# Patient Record
Sex: Female | Born: 1980 | Race: White | Hispanic: No | Marital: Married | Smoking: Never smoker
Health system: Southern US, Community
[De-identification: ages and names within clinical notes are randomized; demographics above are authoritative.]

## PROBLEM LIST (undated history)

## (undated) DIAGNOSIS — H9193 Unspecified hearing loss, bilateral: Secondary | ICD-10-CM

---

## 1999-10-08 ENCOUNTER — Other Ambulatory Visit: Admission: RE | Admit: 1999-10-08 | Discharge: 1999-10-08 | Payer: Self-pay | Admitting: *Deleted

## 2000-10-25 ENCOUNTER — Other Ambulatory Visit: Admission: RE | Admit: 2000-10-25 | Discharge: 2000-10-25 | Payer: Self-pay | Admitting: Obstetrics and Gynecology

## 2001-08-08 ENCOUNTER — Encounter: Payer: Self-pay | Admitting: Internal Medicine

## 2001-08-08 ENCOUNTER — Ambulatory Visit (HOSPITAL_COMMUNITY): Admission: RE | Admit: 2001-08-08 | Discharge: 2001-08-08 | Payer: Self-pay | Admitting: Internal Medicine

## 2001-10-26 ENCOUNTER — Other Ambulatory Visit: Admission: RE | Admit: 2001-10-26 | Discharge: 2001-10-26 | Payer: Self-pay | Admitting: Obstetrics and Gynecology

## 2002-11-07 ENCOUNTER — Other Ambulatory Visit: Admission: RE | Admit: 2002-11-07 | Discharge: 2002-11-07 | Payer: Self-pay | Admitting: Obstetrics and Gynecology

## 2008-10-21 ENCOUNTER — Inpatient Hospital Stay (HOSPITAL_COMMUNITY): Admission: AD | Admit: 2008-10-21 | Discharge: 2008-10-24 | Payer: Self-pay | Admitting: Obstetrics & Gynecology

## 2010-04-09 LAB — HEPATIC FUNCTION PANEL
ALT: 25 U/L (ref 0–35)
AST: 54 U/L — ABNORMAL HIGH (ref 0–37)
Albumin: 2.2 g/dL — ABNORMAL LOW (ref 3.5–5.2)
Alkaline Phosphatase: 156 U/L — ABNORMAL HIGH (ref 39–117)
Bilirubin, Direct: 0.2 mg/dL (ref 0.0–0.3)
Indirect Bilirubin: 0.2 mg/dL — ABNORMAL LOW (ref 0.3–0.9)
Total Bilirubin: 0.4 mg/dL (ref 0.3–1.2)
Total Protein: 5.3 g/dL — ABNORMAL LOW (ref 6.0–8.3)

## 2010-04-09 LAB — CBC
HCT: 32 % — ABNORMAL LOW (ref 36.0–46.0)
HCT: 39.2 % (ref 36.0–46.0)
Hemoglobin: 10.6 g/dL — ABNORMAL LOW (ref 12.0–15.0)
Hemoglobin: 13 g/dL (ref 12.0–15.0)
MCHC: 33.1 g/dL (ref 30.0–36.0)
MCHC: 33.3 g/dL (ref 30.0–36.0)
MCV: 85.2 fL (ref 78.0–100.0)
MCV: 86.2 fL (ref 78.0–100.0)
Platelets: 148 10*3/uL — ABNORMAL LOW (ref 150–400)
Platelets: 173 10*3/uL (ref 150–400)
RBC: 3.71 MIL/uL — ABNORMAL LOW (ref 3.87–5.11)
RBC: 4.59 MIL/uL (ref 3.87–5.11)
RDW: 15.5 % (ref 11.5–15.5)
RDW: 16.1 % — ABNORMAL HIGH (ref 11.5–15.5)
WBC: 17.9 10*3/uL — ABNORMAL HIGH (ref 4.0–10.5)
WBC: 9.5 10*3/uL (ref 4.0–10.5)

## 2010-04-09 LAB — RPR: RPR Ser Ql: NONREACTIVE

## 2010-04-09 LAB — CCBB MATERNAL DONOR DRAW

## 2012-01-14 ENCOUNTER — Other Ambulatory Visit (HOSPITAL_COMMUNITY): Payer: Self-pay | Admitting: Physician Assistant

## 2012-01-14 DIAGNOSIS — R109 Unspecified abdominal pain: Secondary | ICD-10-CM

## 2012-01-14 DIAGNOSIS — K591 Functional diarrhea: Secondary | ICD-10-CM

## 2012-01-17 ENCOUNTER — Ambulatory Visit (HOSPITAL_COMMUNITY)
Admission: RE | Admit: 2012-01-17 | Discharge: 2012-01-17 | Disposition: A | Discharge status: Home / Self Care | Payer: BC Managed Care – PPO | Source: Ambulatory Visit | Attending: Physician Assistant | Admitting: Physician Assistant

## 2012-01-17 DIAGNOSIS — K591 Functional diarrhea: Secondary | ICD-10-CM

## 2012-01-17 DIAGNOSIS — R109 Unspecified abdominal pain: Secondary | ICD-10-CM

## 2012-01-17 DIAGNOSIS — R1310 Dysphagia, unspecified: Secondary | ICD-10-CM | POA: Insufficient documentation

## 2012-01-26 ENCOUNTER — Encounter (INDEPENDENT_AMBULATORY_CARE_PROVIDER_SITE_OTHER): Payer: Self-pay | Admitting: *Deleted

## 2012-01-31 ENCOUNTER — Encounter (INDEPENDENT_AMBULATORY_CARE_PROVIDER_SITE_OTHER): Payer: Self-pay | Admitting: Internal Medicine

## 2012-01-31 ENCOUNTER — Ambulatory Visit (INDEPENDENT_AMBULATORY_CARE_PROVIDER_SITE_OTHER): Payer: BC Managed Care – PPO | Admitting: Internal Medicine

## 2012-01-31 VITALS — BP 90/52 | HR 76 | Temp 98.4°F | Ht 64.0 in | Wt 134.9 lb

## 2012-01-31 DIAGNOSIS — R197 Diarrhea, unspecified: Secondary | ICD-10-CM | POA: Insufficient documentation

## 2012-01-31 DIAGNOSIS — K589 Irritable bowel syndrome without diarrhea: Secondary | ICD-10-CM

## 2012-01-31 MED ORDER — HYOSCYAMINE SULFATE 0.125 MG SL SUBL: 0.1250 mg | SUBLINGUAL_TABLET | SUBLINGUAL | Status: DC | PRN

## 2012-01-31 NOTE — Progress Notes (Addendum)
Subjective:     Patient ID: Marilyn Bryant, female   DOB: 11-25-1980, 32 y.o.   MRN: [id number]  HPI Referred to our office for diarrhea. The first of the year, everytime she ate she would have diarrhea. She would have lower abdominal pain and then she would go to the bathroom. Her symptoms last about 7 days. No fever associated with her symptoms. She tells me she has a hx of as soon as she eats, she will have diarrhea.  She tells me she is 80% better.  She did have diarrhea Saturday. Her stools are formed now. Actually she is more on the constipated side.  Appetite is good. No weight loss.  No epigastric pain. She tells me if feels like gas.  She tried eat wheat and it did upset her stomach, however this weekend she ate white bread and it did not bother her. Stool studies were negative. Swallow test was normal.   No dysphagia. 01/14/2012 NA 137, K 3.9, Chloride 101, ALP 50, AST 17, ALT 14, Calcium 9.6, 01/14/2012 H and H 14.3 and 40.8, Platelet ct 294 Celiac Panel, IgA negative, gliadin Peptide AB igG 30.2 high. (positive)  (usually less than 20), H and H 14.3 and 40.8, MCV 87.7, sedrate 4, TSH 1.563 Stool lactoferrin negative, c diff negative. Stool culture negative for salmonella, shigelia, campylobacter, Yersinia, or E. Coli H. Pylori negative 01/14/2012 Esophagram: normal   Patient has an IUD  Review of Systems see hpi Current Outpatient Prescriptions  Medication Sig Dispense Refill  . ibuprofen (ADVIL,MOTRIN) 100 MG chewable tablet Chew 100 mg by mouth every 8 (eight) hours as needed.       History reviewed. No pertinent past medical history. Past Surgical History  Procedure Date  . Cesarean section     2010   Allergies not on file       Objective:   Physical Exam  Filed Vitals:   01/31/12 1517  BP: 90/52  Pulse: 76  Temp: 98.4 F (36.9 C)  Height: 5\' 4"  (1.626 m)  Weight: 134 lb 14.4 oz (61.19 kg)   Alert and oriented. Skin warm and dry. Oral mucosa is moist.   .  Sclera anicteric, conjunctivae is pink. Thyroid not enlarged. No cervical lymphadenopathy. Lungs clear. Heart regular rate and rhythm.  Abdomen is soft. Bowel sounds are positive. No hepatomegaly. No abdominal masses felt. No tenderness.  No edema to lower extremities.       Assessment:    IBS. Symptoms for the most part have resolved now. Stools studies negative. I discussed this case with Dr. Karilyn Cota. Celiac panel is negative.    Plan:    Levsin 0.125mg  sl. OV prn. If any problems, please call our office.

## 2012-01-31 NOTE — Patient Instructions (Addendum)
Start Levsin q 4 hrs as need. OV prn,.

## 2012-02-09 ENCOUNTER — Encounter (INDEPENDENT_AMBULATORY_CARE_PROVIDER_SITE_OTHER): Payer: Self-pay

## 2013-03-16 ENCOUNTER — Emergency Department (HOSPITAL_COMMUNITY)
Admission: EM | Admit: 2013-03-16 | Discharge: 2013-03-16 | Disposition: A | Discharge status: Home / Self Care | Payer: BC Managed Care – PPO | Attending: Emergency Medicine | Admitting: Emergency Medicine

## 2013-03-16 ENCOUNTER — Encounter (HOSPITAL_COMMUNITY): Payer: Self-pay | Admitting: Emergency Medicine

## 2013-03-16 DIAGNOSIS — M549 Dorsalgia, unspecified: Secondary | ICD-10-CM | POA: Insufficient documentation

## 2013-03-16 DIAGNOSIS — Z3202 Encounter for pregnancy test, result negative: Secondary | ICD-10-CM | POA: Insufficient documentation

## 2013-03-16 DIAGNOSIS — R55 Syncope and collapse: Secondary | ICD-10-CM

## 2013-03-16 LAB — BASIC METABOLIC PANEL
BUN: 14 mg/dL (ref 6–23)
CO2: 27 mEq/L (ref 19–32)
Calcium: 9.3 mg/dL (ref 8.4–10.5)
Chloride: 99 mEq/L (ref 96–112)
Creatinine, Ser: 0.66 mg/dL (ref 0.50–1.10)
GFR calc Af Amer: >90 mL/min (ref >90)
Glucose, Bld: 143 mg/dL — ABNORMAL HIGH (ref 70–99)
Potassium: 4.1 mEq/L (ref 3.7–5.3)
SODIUM: 139 meq/L (ref 137–147)

## 2013-03-16 LAB — I-STAT TROPONIN, ED: Troponin i, poc: 0 ng/mL (ref 0.00–0.08)

## 2013-03-16 LAB — CBC
HCT: 38.7 % (ref 36.0–46.0)
Hemoglobin: 13.3 g/dL (ref 12.0–15.0)
MCH: 30.8 pg (ref 26.0–34.0)
MCHC: 34.4 g/dL (ref 30.0–36.0)
MCV: 89.6 fL (ref 78.0–100.0)
PLATELETS: 255 10*3/uL (ref 150–400)
RBC: 4.32 MIL/uL (ref 3.87–5.11)
RDW: 12.8 % (ref 11.5–15.5)
WBC: 11 10*3/uL — ABNORMAL HIGH (ref 4.0–10.5)

## 2013-03-16 LAB — URINALYSIS, ROUTINE W REFLEX MICROSCOPIC
BILIRUBIN URINE: NEGATIVE
Glucose, UA: NEGATIVE mg/dL
HGB URINE DIPSTICK: NEGATIVE
Ketones, ur: 15 mg/dL — AB
Nitrite: NEGATIVE
PROTEIN: NEGATIVE mg/dL
Specific Gravity, Urine: 1.016 (ref 1.005–1.030)
UROBILINOGEN UA: 0.2 mg/dL (ref 0.0–1.0)
pH: 7 (ref 5.0–8.0)

## 2013-03-16 LAB — CBG MONITORING, ED: Glucose-Capillary: 89 mg/dL (ref 70–99)

## 2013-03-16 LAB — URINE MICROSCOPIC-ADD ON

## 2013-03-16 LAB — POC URINE PREG, ED: Preg Test, Ur: NEGATIVE

## 2013-03-16 NOTE — ED Provider Notes (Signed)
I saw and evaluated the patient, reviewed the resident's note and I agree with the findings and plan.   Date: 03/16/2013  Rate: 96  Rhythm: normal sinus rhythm  QRS Axis: normal  Intervals: normal  ST/T Wave abnormalities: nonspecific T waves  Conduction Disutrbances:none  Narrative Interpretation:   Old EKG Reviewed: none available   With recurrent right lower back pain. She states her pain radiates down to her knee. She feels like she passed out because the pain got so severe when she tried to stand up and twisted. She states she felt nauseous, lightheaded and gradually passed out. She denies chest pain or shortness of breath. Her EKG is benign for a cause of syncope. Her pain is controlled at this time. There is no midline back tenderness and I do not feel she needs an x-ray. Her pain is not near her CVA she has not had any dysuria or hematuria to suggest kidney stone. This time we'll treat his sciatica and her followup with her PCP. I feel that her syncope is related to her pain/vagal response.  Ephraim Hamburger, MD 03/16/13 [phone]

## 2013-03-16 NOTE — ED Notes (Addendum)
Pt was at work and had a sudden R flank pain and stood up to tell co-workers and they said she "turned white and passed out." They assisted her to floor and "then i woke back up after about 30 seconds." now she continues to have R flank pain. She is A&Ox4, ambulatory. She c/o feeling lightheaded now. She didn't eat breakfast this morning.

## 2013-03-16 NOTE — ED Provider Notes (Signed)
CSN: [id number]     Arrival date & time 03/16/13  1338 History   First MD Initiated Contact with Patient 03/16/13 1502     Chief Complaint  Patient presents with  . Loss of Consciousness     (Consider location/radiation/quality/duration/timing/severity/associated sxs/prior Treatment) Patient is a 33 y.o. female presenting with syncope. The history is provided by the patient.  Loss of Consciousness Episode history:  Single Most recent episode:  Today Duration:  30 seconds Timing:  Constant Progression:  Resolved Chronicity:  New Context: standing up   Context: not blood draw and not exertion   Context comment:  Pt has hx of subacute R LBP and states that she felt awas hurting, twisted the wrong way and had severe pain, stood up and than passed out for about 30 seconds, no jerking, came back to normal baseline when woke up, no b/b incontinence, no tongue bitign, Witnessed: yes   Relieved by:  Nothing Worsened by:  Nothing tried Ineffective treatments:  None tried Associated symptoms: no chest pain, no diaphoresis, no difficulty breathing, no fever, no focal weakness, no headaches, no recent fall, no shortness of breath, no vomiting and no weakness   Risk factors: no coronary artery disease     History reviewed. No pertinent past medical history. Past Surgical History  Procedure Laterality Date  . Cesarean section      2010   History reviewed. No pertinent family history. History  Substance Use Topics  . Smoking status: Never Smoker   . Smokeless tobacco: Not on file  . Alcohol Use: No   OB History   Grav Para Term Preterm Abortions TAB SAB Ect Mult Living                 Review of Systems  Constitutional: Negative for fever, diaphoresis, activity change and appetite change.  HENT: Negative for congestion and sore throat.   Eyes: Negative for discharge and itching.  Respiratory: Negative for shortness of breath and wheezing.   Cardiovascular: Positive for syncope.  Negative for chest pain.  Gastrointestinal: Negative for vomiting and abdominal pain.  Genitourinary: Negative for decreased urine volume and difficulty urinating.  Musculoskeletal: Positive for back pain.  Skin: Negative for rash.  Neurological: Positive for syncope. Negative for focal weakness, weakness and headaches.      Allergies  Review of patient's allergies indicates no active allergies.  Home Medications   Current Outpatient Rx  Name  Route  Sig  Dispense  Refill  . ibuprofen (ADVIL,MOTRIN) 100 MG chewable tablet   Oral   Chew 100 mg by mouth every 8 (eight) hours as needed.          BP 102/56  Pulse 89  Temp(Src) 98 F (36.7 C) (Oral)  Resp 14  Ht 5\' 4"  (1.626 m)  Wt 131 lb (59.421 kg)  BMI 22.47 kg/m2  SpO2 100% Physical Exam  Constitutional: She is oriented to person, place, and time. She appears well-developed and well-nourished. No distress.  HENT:  Head: Normocephalic and atraumatic.  Mouth/Throat: Oropharynx is clear and moist.  Eyes: Conjunctivae and EOM are normal. Pupils are equal, round, and reactive to light. Right eye exhibits no discharge. Left eye exhibits no discharge. No scleral icterus.  Neck: Normal range of motion. Neck supple.  Cardiovascular: Normal rate, regular rhythm and intact distal pulses.  Exam reveals no gallop and no friction rub.   No murmur heard. Pulmonary/Chest: Effort normal and breath sounds normal. No respiratory distress. She has no wheezes. She  has no rales.  Abdominal: Soft. She exhibits no distension and no mass. There is no tenderness.  Musculoskeletal: Normal range of motion.  Neurological: She is alert and oriented to person, place, and time. No cranial nerve deficit. She exhibits normal muscle tone. Coordination normal.  5/5 strength in all exts, normal sensation in all exts, 2+ DTRs in patella and brachioradilias b/l, F2N and H2S neg, no ataxia with ambulation  Skin: She is not diaphoretic.    ED Course   Procedures (including critical care time) Labs Review Labs Reviewed  CBC - Abnormal; Notable for the following:    WBC 11.0 (*)    All other components within normal limits  BASIC METABOLIC PANEL - Abnormal; Notable for the following:    Glucose, Bld 143 (*)    All other components within normal limits  URINALYSIS, ROUTINE W REFLEX MICROSCOPIC - Abnormal; Notable for the following:    Ketones, ur 15 (*)    Leukocytes, UA TRACE (*)    All other components within normal limits  URINE MICROSCOPIC-ADD ON - Abnormal; Notable for the following:    Squamous Epithelial / LPF FEW (*)    Bacteria, UA FEW (*)    All other components within normal limits  I-STAT TROPOININ, ED  POC URINE PREG, ED  CBG MONITORING, ED  CBG MONITORING, ED   Imaging Review No results found.   EKG Interpretation None      MDM   33 y.o. WF w/ cc: of syncope after R flank pain. Pt with hx of R flank pain for past "4 years" states it has bothered her since she had her child 4 years ago and states that she aggravated it, turned the wrong way and had immense pain, and then stood up and passed out. No signs sxs c/w seizure. No hx of prior. Well appearing, no palpitations, unlikely arrythmia. EKG with no ischemic change or signs of arrythmia. Not c/w w/ pyelo or kidney stone, urine negative. Likely had vagal event from sudden surprising pain. Offered analgesia, pt politely declined. Will discharge. Follow up PCP as needed. Care of case d/w my attending.  Final diagnoses:  Syncope    Discharged    Sol Passer, MD 03/16/13 [phone]

## 2013-03-16 NOTE — Discharge Instructions (Signed)
Sciatica °Sciatica is pain, weakness, numbness, or tingling along the path of the sciatic nerve. The nerve starts in the lower back and runs down the back of each leg. The nerve controls the muscles in the lower leg and in the back of the knee, while also providing sensation to the back of the thigh, lower leg, and the sole of your foot. Sciatica is a symptom of another medical condition. For instance, nerve damage or certain conditions, such as a herniated disk or bone spur on the spine, pinch or put pressure on the sciatic nerve. This causes the pain, weakness, or other sensations normally associated with sciatica. Generally, sciatica only affects one side of the body. °CAUSES  °· Herniated or slipped disc. °· Degenerative disk disease. °· A pain disorder involving the narrow muscle in the buttocks (piriformis syndrome). °· Pelvic injury or fracture. °· Pregnancy. °· Tumor (rare). °SYMPTOMS  °Symptoms can vary from mild to very severe. The symptoms usually travel from the low back to the buttocks and down the back of the leg. Symptoms can include: °· Mild tingling or dull aches in the lower back, leg, or hip. °· Numbness in the back of the calf or sole of the foot. °· Burning sensations in the lower back, leg, or hip. °· Sharp pains in the lower back, leg, or hip. °· Leg weakness. °· Severe back pain inhibiting movement. °These symptoms may get worse with coughing, sneezing, laughing, or prolonged sitting or standing. Also, being overweight may worsen symptoms. °DIAGNOSIS  °Your caregiver will perform a physical exam to look for common symptoms of sciatica. He or she may ask you to do certain movements or activities that would trigger sciatic nerve pain. Other tests may be performed to find the cause of the sciatica. These may include: °· Blood tests. °· X-rays. °· Imaging tests, such as an MRI or CT scan. °TREATMENT  °Treatment is directed at the cause of the sciatic pain. Sometimes, treatment is not necessary  and the pain and discomfort goes away on its own. If treatment is needed, your caregiver may suggest: °· Over-the-counter medicines to relieve pain. °· Prescription medicines, such as anti-inflammatory medicine, muscle relaxants, or narcotics. °· Applying heat or ice to the painful area. °· Steroid injections to lessen pain, irritation, and inflammation around the nerve. °· Reducing activity during periods of pain. °· Exercising and stretching to strengthen your abdomen and improve flexibility of your spine. Your caregiver may suggest losing weight if the extra weight makes the back pain worse. °· Physical therapy. °· Surgery to eliminate what is pressing or pinching the nerve, such as a bone spur or part of a herniated disk. °HOME CARE INSTRUCTIONS  °· Only take over-the-counter or prescription medicines for pain or discomfort as directed by your caregiver. °· Apply ice to the affected area for 20 minutes, 3 4 times a day for the first 48 72 hours. Then try heat in the same way. °· Exercise, stretch, or perform your usual activities if these do not aggravate your pain. °· Attend physical therapy sessions as directed by your caregiver. °· Keep all follow-up appointments as directed by your caregiver. °· Do not wear high heels or shoes that do not provide proper support. °· Check your mattress to see if it is too soft. A firm mattress may lessen your pain and discomfort. °SEEK IMMEDIATE MEDICAL CARE IF:  °· You lose control of your bowel or bladder (incontinence). °· You have increasing weakness in the lower back,   pelvis, buttocks, or legs. °· You have redness or swelling of your back. °· You have a burning sensation when you urinate. °· You have pain that gets worse when you lie down or awakens you at night. °· Your pain is worse than you have experienced in the past. °· Your pain is lasting longer than 4 weeks. °· You are suddenly losing weight without reason. °MAKE SURE YOU: °· Understand these  instructions. °· Will watch your condition. °· Will get help right away if you are not doing well or get worse. °Document Released: 12/15/2000 Document Revised: 06/22/2011 Document Reviewed: 05/02/2011 °[employer]® Patient Information ©2014 [employer]. ° °

## 2013-03-21 ENCOUNTER — Ambulatory Visit (HOSPITAL_COMMUNITY)
Admission: RE | Admit: 2013-03-21 | Discharge: 2013-03-21 | Disposition: A | Discharge status: Home / Self Care | Payer: BC Managed Care – PPO | Source: Ambulatory Visit | Attending: Family Medicine | Admitting: Family Medicine

## 2013-03-21 ENCOUNTER — Other Ambulatory Visit (HOSPITAL_COMMUNITY): Payer: Self-pay | Admitting: Family Medicine

## 2013-03-21 DIAGNOSIS — M545 Low back pain, unspecified: Secondary | ICD-10-CM

## 2013-03-21 DIAGNOSIS — Z975 Presence of (intrauterine) contraceptive device: Secondary | ICD-10-CM | POA: Insufficient documentation

## 2013-03-21 DIAGNOSIS — M4120 Other idiopathic scoliosis, site unspecified: Secondary | ICD-10-CM | POA: Insufficient documentation

## 2013-03-22 ENCOUNTER — Other Ambulatory Visit (HOSPITAL_COMMUNITY): Payer: Self-pay | Admitting: Family Medicine

## 2013-03-22 DIAGNOSIS — M545 Low back pain, unspecified: Secondary | ICD-10-CM

## 2013-03-23 ENCOUNTER — Encounter (HOSPITAL_COMMUNITY): Payer: Self-pay

## 2013-03-23 ENCOUNTER — Other Ambulatory Visit (HOSPITAL_COMMUNITY): Payer: Self-pay | Admitting: Family Medicine

## 2013-03-23 ENCOUNTER — Ambulatory Visit (HOSPITAL_COMMUNITY)
Admission: RE | Admit: 2013-03-23 | Discharge: 2013-03-23 | Disposition: A | Discharge status: Home / Self Care | Payer: BC Managed Care – PPO | Source: Ambulatory Visit | Attending: Family Medicine | Admitting: Family Medicine

## 2013-03-23 DIAGNOSIS — M5137 Other intervertebral disc degeneration, lumbosacral region: Secondary | ICD-10-CM | POA: Insufficient documentation

## 2013-03-23 DIAGNOSIS — R229 Localized swelling, mass and lump, unspecified: Secondary | ICD-10-CM | POA: Insufficient documentation

## 2013-03-23 DIAGNOSIS — M545 Low back pain, unspecified: Secondary | ICD-10-CM

## 2013-03-23 DIAGNOSIS — M51379 Other intervertebral disc degeneration, lumbosacral region without mention of lumbar back pain or lower extremity pain: Secondary | ICD-10-CM | POA: Insufficient documentation

## 2013-03-23 DIAGNOSIS — L723 Sebaceous cyst: Secondary | ICD-10-CM | POA: Insufficient documentation

## 2013-05-29 ENCOUNTER — Other Ambulatory Visit: Payer: Self-pay | Admitting: Obstetrics & Gynecology

## 2013-06-01 ENCOUNTER — Encounter (HOSPITAL_COMMUNITY): Payer: Self-pay | Admitting: *Deleted

## 2013-06-01 ENCOUNTER — Encounter (HOSPITAL_COMMUNITY): Payer: Self-pay | Admitting: Pharmacist

## 2013-06-11 ENCOUNTER — Encounter (HOSPITAL_COMMUNITY)
Admission: RE | Disposition: A | Discharge status: Home / Self Care | Payer: Self-pay | Source: Ambulatory Visit | Attending: Obstetrics & Gynecology

## 2013-06-11 ENCOUNTER — Ambulatory Visit (HOSPITAL_COMMUNITY)
Admission: RE | Admit: 2013-06-11 | Discharge: 2013-06-11 | Disposition: A | Discharge status: Home / Self Care | Payer: BC Managed Care – PPO | Source: Ambulatory Visit | Attending: Obstetrics & Gynecology | Admitting: Obstetrics & Gynecology

## 2013-06-11 ENCOUNTER — Encounter (HOSPITAL_COMMUNITY): Payer: Self-pay | Admitting: *Deleted

## 2013-06-11 ENCOUNTER — Ambulatory Visit (HOSPITAL_COMMUNITY): Payer: BC Managed Care – PPO | Admitting: Anesthesiology

## 2013-06-11 ENCOUNTER — Encounter (HOSPITAL_COMMUNITY): Payer: BC Managed Care – PPO | Admitting: Anesthesiology

## 2013-06-11 DIAGNOSIS — Q505 Embryonic cyst of broad ligament: Secondary | ICD-10-CM

## 2013-06-11 DIAGNOSIS — N949 Unspecified condition associated with female genital organs and menstrual cycle: Secondary | ICD-10-CM | POA: Insufficient documentation

## 2013-06-11 DIAGNOSIS — N83209 Unspecified ovarian cyst, unspecified side: Secondary | ICD-10-CM | POA: Insufficient documentation

## 2013-06-11 DIAGNOSIS — Q504 Embryonic cyst of fallopian tube: Secondary | ICD-10-CM | POA: Insufficient documentation

## 2013-06-11 HISTORY — DX: Unspecified hearing loss, bilateral: H91.93

## 2013-06-11 HISTORY — PX: ROBOTIC ASSISTED LAPAROSCOPIC OVARIAN CYSTECTOMY: SHX6081

## 2013-06-11 LAB — CBC
HCT: 41.7 % (ref 36.0–46.0)
Hemoglobin: 14.2 g/dL (ref 12.0–15.0)
MCH: 30.8 pg (ref 26.0–34.0)
MCHC: 34.1 g/dL (ref 30.0–36.0)
MCV: 90.5 fL (ref 78.0–100.0)
Platelets: 254 10*3/uL (ref 150–400)
RBC: 4.61 MIL/uL (ref 3.87–5.11)
RDW: 12.7 % (ref 11.5–15.5)
WBC: 6 10*3/uL (ref 4.0–10.5)

## 2013-06-11 LAB — TYPE AND SCREEN
ABO/RH(D): O POS
Antibody Screen: NEGATIVE

## 2013-06-11 LAB — PREGNANCY, URINE: PREG TEST UR: NEGATIVE

## 2013-06-11 LAB — ABO/RH: ABO/RH(D): O POS

## 2013-06-11 SURGERY — ROBOTIC ASSISTED LAPAROSCOPIC OVARIAN CYSTECTOMY
Anesthesia: General | Site: Abdomen | Laterality: Right

## 2013-06-11 MED ORDER — ROCURONIUM BROMIDE 100 MG/10ML IV SOLN
INTRAVENOUS | Status: DC | PRN
  Administered 2013-06-11: 10 mg via INTRAVENOUS
  Administered 2013-06-11: 40 mg via INTRAVENOUS

## 2013-06-11 MED ORDER — PROPOFOL 10 MG/ML IV EMUL
INTRAVENOUS | Status: AC
  Filled 2013-06-11: qty 20

## 2013-06-11 MED ORDER — FENTANYL CITRATE 0.05 MG/ML IJ SOLN
INTRAMUSCULAR | Status: AC
  Filled 2013-06-11: qty 5

## 2013-06-11 MED ORDER — MIDAZOLAM HCL 2 MG/2ML IJ SOLN
INTRAMUSCULAR | Status: DC | PRN
  Administered 2013-06-11: 2 mg via INTRAVENOUS

## 2013-06-11 MED ORDER — LIDOCAINE HCL (CARDIAC) 20 MG/ML IV SOLN
INTRAVENOUS | Status: DC | PRN
  Administered 2013-06-11: 60 mg via INTRAVENOUS

## 2013-06-11 MED ORDER — KETOROLAC TROMETHAMINE 30 MG/ML IJ SOLN
INTRAMUSCULAR | Status: DC | PRN
  Administered 2013-06-11: 30 mg via INTRAVENOUS

## 2013-06-11 MED ORDER — LACTATED RINGERS IV SOLN
INTRAVENOUS | Status: DC
  Administered 2013-06-11 (×2): via INTRAVENOUS

## 2013-06-11 MED ORDER — FENTANYL CITRATE 0.05 MG/ML IJ SOLN
INTRAMUSCULAR | Status: DC | PRN
  Administered 2013-06-11: 100 ug via INTRAVENOUS
  Administered 2013-06-11 (×3): 50 ug via INTRAVENOUS

## 2013-06-11 MED ORDER — OXYCODONE-ACETAMINOPHEN 5-325 MG PO TABS
1.0000 | ORAL_TABLET | ORAL | Status: DC | PRN
  Administered 2013-06-11: 1 via ORAL

## 2013-06-11 MED ORDER — HYDROMORPHONE HCL PF 1 MG/ML IJ SOLN
INTRAMUSCULAR | Status: AC
  Filled 2013-06-11: qty 1

## 2013-06-11 MED ORDER — CEFAZOLIN SODIUM-DEXTROSE 2-3 GM-% IV SOLR: 2.0000 g | INTRAVENOUS | Status: DC

## 2013-06-11 MED ORDER — METOCLOPRAMIDE HCL 5 MG/ML IJ SOLN
INTRAMUSCULAR | Status: AC
  Filled 2013-06-11: qty 2

## 2013-06-11 MED ORDER — DEXAMETHASONE SODIUM PHOSPHATE 10 MG/ML IJ SOLN
INTRAMUSCULAR | Status: AC
  Filled 2013-06-11: qty 1

## 2013-06-11 MED ORDER — SODIUM CHLORIDE 0.9 % IJ SOLN
INTRAMUSCULAR | Status: AC
  Filled 2013-06-11: qty 50

## 2013-06-11 MED ORDER — GLYCOPYRROLATE 0.2 MG/ML IJ SOLN
INTRAMUSCULAR | Status: DC | PRN
  Administered 2013-06-11: 0.4 mg via INTRAVENOUS

## 2013-06-11 MED ORDER — PROPOFOL 10 MG/ML IV BOLUS
INTRAVENOUS | Status: DC | PRN
  Administered 2013-06-11: 200 mg via INTRAVENOUS

## 2013-06-11 MED ORDER — VASOPRESSIN 20 UNIT/ML IJ SOLN
INTRAMUSCULAR | Status: AC
  Filled 2013-06-11: qty 1

## 2013-06-11 MED ORDER — OXYCODONE-ACETAMINOPHEN 7.5-325 MG PO TABS: 1.0000 | ORAL_TABLET | ORAL | Status: DC | PRN

## 2013-06-11 MED ORDER — BUPIVACAINE HCL (PF) 0.25 % IJ SOLN
INTRAMUSCULAR | Status: DC | PRN
  Administered 2013-06-11: 16 mL

## 2013-06-11 MED ORDER — DEXAMETHASONE SODIUM PHOSPHATE 10 MG/ML IJ SOLN
INTRAMUSCULAR | Status: DC | PRN
  Administered 2013-06-11: 10 mg via INTRAVENOUS

## 2013-06-11 MED ORDER — ONDANSETRON HCL 4 MG/2ML IJ SOLN
INTRAMUSCULAR | Status: AC
  Filled 2013-06-11: qty 2

## 2013-06-11 MED ORDER — ONDANSETRON HCL 4 MG/2ML IJ SOLN
INTRAMUSCULAR | Status: DC | PRN
  Administered 2013-06-11: 4 mg via INTRAVENOUS

## 2013-06-11 MED ORDER — METOCLOPRAMIDE HCL 5 MG/ML IJ SOLN
10.0000 mg | Freq: Once | INTRAMUSCULAR | Status: AC | PRN
  Administered 2013-06-11 (×2): 10 mg via INTRAVENOUS

## 2013-06-11 MED ORDER — LIDOCAINE HCL (CARDIAC) 20 MG/ML IV SOLN
INTRAVENOUS | Status: AC
  Filled 2013-06-11: qty 5

## 2013-06-11 MED ORDER — ROCURONIUM BROMIDE 100 MG/10ML IV SOLN
INTRAVENOUS | Status: AC
  Filled 2013-06-11: qty 1

## 2013-06-11 MED ORDER — METOCLOPRAMIDE HCL 5 MG/ML IJ SOLN
INTRAMUSCULAR | Status: AC
  Administered 2013-06-11: 10 mg via INTRAVENOUS
  Filled 2013-06-11: qty 2

## 2013-06-11 MED ORDER — OXYCODONE-ACETAMINOPHEN 5-325 MG PO TABS
ORAL_TABLET | ORAL | Status: AC
  Administered 2013-06-11: 1 via ORAL
  Filled 2013-06-11: qty 1

## 2013-06-11 MED ORDER — CEFAZOLIN SODIUM-DEXTROSE 2-3 GM-% IV SOLR
INTRAVENOUS | Status: AC
  Administered 2013-06-11: 2 g via INTRAVENOUS
  Filled 2013-06-11: qty 50

## 2013-06-11 MED ORDER — BUPIVACAINE HCL (PF) 0.25 % IJ SOLN
INTRAMUSCULAR | Status: AC
  Filled 2013-06-11: qty 30

## 2013-06-11 MED ORDER — NEOSTIGMINE METHYLSULFATE 10 MG/10ML IV SOLN
INTRAVENOUS | Status: AC
  Filled 2013-06-11: qty 1

## 2013-06-11 MED ORDER — HYDROMORPHONE HCL PF 1 MG/ML IJ SOLN
0.2500 mg | INTRAMUSCULAR | Status: DC | PRN
  Administered 2013-06-11: 0.5 mg via INTRAVENOUS

## 2013-06-11 MED ORDER — MIDAZOLAM HCL 2 MG/2ML IJ SOLN
INTRAMUSCULAR | Status: AC
  Filled 2013-06-11: qty 2

## 2013-06-11 MED ORDER — GLYCOPYRROLATE 0.2 MG/ML IJ SOLN
INTRAMUSCULAR | Status: AC
  Filled 2013-06-11: qty 2

## 2013-06-11 MED ORDER — NEOSTIGMINE METHYLSULFATE 10 MG/10ML IV SOLN
INTRAVENOUS | Status: DC | PRN
  Administered 2013-06-11: 2 mg via INTRAVENOUS

## 2013-06-11 SURGICAL SUPPLY — 63 items
BARRIER ADHS 3X4 INTERCEED (GAUZE/BANDAGES/DRESSINGS) ×2 IMPLANT
CHLORAPREP W/TINT 26ML (MISCELLANEOUS) ×2 IMPLANT
CLOTH BEACON ORANGE TIMEOUT ST (SAFETY) ×2 IMPLANT
CONT PATH 16OZ SNAP LID 3702 (MISCELLANEOUS) ×2 IMPLANT
COVER MAYO STAND STRL (DRAPES) ×2 IMPLANT
COVER TABLE BACK 60X90 (DRAPES) ×4 IMPLANT
COVER TIP SHEARS 8 DVNC (MISCELLANEOUS) ×1 IMPLANT
COVER TIP SHEARS 8MM DA VINCI (MISCELLANEOUS) ×1
DECANTER SPIKE VIAL GLASS SM (MISCELLANEOUS) ×2 IMPLANT
DERMABOND ADVANCED (GAUZE/BANDAGES/DRESSINGS) ×1
DERMABOND ADVANCED .7 DNX12 (GAUZE/BANDAGES/DRESSINGS) ×1 IMPLANT
DRAPE HUG U DISPOSABLE (DRAPE) ×2 IMPLANT
DRAPE LG THREE QUARTER DISP (DRAPES) ×4 IMPLANT
DRAPE WARM FLUID 44X44 (DRAPE) ×2 IMPLANT
DRSG COVADERM PLUS 2X2 (GAUZE/BANDAGES/DRESSINGS) ×2 IMPLANT
DRSG OPSITE POSTOP 3X4 (GAUZE/BANDAGES/DRESSINGS) ×2 IMPLANT
ELECT REM PT RETURN 9FT ADLT (ELECTROSURGICAL) ×2
ELECTRODE REM PT RTRN 9FT ADLT (ELECTROSURGICAL) ×1 IMPLANT
EVACUATOR SMOKE 8.L (FILTER) ×2 IMPLANT
GAUZE VASELINE 3X9 (GAUZE/BANDAGES/DRESSINGS) IMPLANT
GLOVE BIO SURGEON STRL SZ 6.5 (GLOVE) ×2 IMPLANT
GLOVE BIOGEL PI IND STRL 7.0 (GLOVE) ×1 IMPLANT
GLOVE BIOGEL PI INDICATOR 7.0 (GLOVE) ×1
GOWN STRL REUS W/ TWL LRG LVL3 (GOWN DISPOSABLE) ×10 IMPLANT
GOWN STRL REUS W/TWL LRG LVL3 (GOWN DISPOSABLE) ×22 IMPLANT
IV STOPCOCK 4 WAY 40  W/Y SET (IV SOLUTION) ×1
IV STOPCOCK 4 WAY 40 W/Y SET (IV SOLUTION) ×1 IMPLANT
KIT ACCESSORY DA VINCI DISP (KITS) ×1
KIT ACCESSORY DVNC DISP (KITS) ×1 IMPLANT
NEEDLE HYPO 22GX1.5 SAFETY (NEEDLE) ×2 IMPLANT
OCCLUDER COLPOPNEUMO (BALLOONS) IMPLANT
PACK LAVH (CUSTOM PROCEDURE TRAY) ×2 IMPLANT
PAD PREP 24X48 CUFFED NSTRL (MISCELLANEOUS) ×4 IMPLANT
PROTECTOR NERVE ULNAR (MISCELLANEOUS) ×4 IMPLANT
SET CYSTO W/LG BORE CLAMP LF (SET/KITS/TRAYS/PACK) IMPLANT
SET IRRIG TUBING LAPAROSCOPIC (IRRIGATION / IRRIGATOR) ×4 IMPLANT
SOLUTION ELECTROLUBE (MISCELLANEOUS) ×2 IMPLANT
SUT VIC AB 0 CT1 27 (SUTURE)
SUT VIC AB 0 CT1 27XBRD ANTBC (SUTURE) IMPLANT
SUT VIC AB 0 CT2 27 (SUTURE) IMPLANT
SUT VIC AB 2-0 CT1 27 (SUTURE)
SUT VIC AB 2-0 CT1 TAPERPNT 27 (SUTURE) IMPLANT
SUT VIC AB 3-0 SH 27 (SUTURE)
SUT VIC AB 3-0 SH 27X BRD (SUTURE) IMPLANT
SUT VIC AB 4-0 PS2 27 (SUTURE) ×4 IMPLANT
SUT VICRYL 0 UR6 27IN ABS (SUTURE) ×4 IMPLANT
SYR 50ML LL SCALE MARK (SYRINGE) ×2 IMPLANT
SYSTEM CONVERTIBLE TROCAR (TROCAR) IMPLANT
TIP UTERINE 5.1X6CM LAV DISP (MISCELLANEOUS) IMPLANT
TIP UTERINE 6.7X10CM GRN DISP (MISCELLANEOUS) IMPLANT
TIP UTERINE 6.7X6CM WHT DISP (MISCELLANEOUS) IMPLANT
TIP UTERINE 6.7X8CM BLUE DISP (MISCELLANEOUS) IMPLANT
TOWEL OR 17X24 6PK STRL BLUE (TOWEL DISPOSABLE) ×6 IMPLANT
TRAY FOLEY BAG SILVER LF 14FR (CATHETERS) ×2 IMPLANT
TROCAR 12M 150ML BLUNT (TROCAR) IMPLANT
TROCAR DISP BLADELESS 8 DVNC (TROCAR) ×1 IMPLANT
TROCAR DISP BLADELESS 8MM (TROCAR) ×1
TROCAR OPTI TIP 12M 100M (ENDOMECHANICALS) IMPLANT
TROCAR XCEL 12X100 BLDLESS (ENDOMECHANICALS) ×2 IMPLANT
TROCAR XCEL NON-BLD 5MMX100MML (ENDOMECHANICALS) ×2 IMPLANT
TUBING FILTER THERMOFLATOR (ELECTROSURGICAL) ×4 IMPLANT
WARMER LAPAROSCOPE (MISCELLANEOUS) ×2 IMPLANT
WATER STERILE IRR 1000ML POUR (IV SOLUTION) ×6 IMPLANT

## 2013-06-11 NOTE — Anesthesia Postprocedure Evaluation (Signed)
  Anesthesia Post-op Note  Patient: Marilyn Bryant  Procedure(s) Performed: Procedure(s) with comments: ROBOTIC ASSISTED LAPAROSCOPIC  RIGHT OVARIAN CYSTECTOMY (Right) - 90 min.  Patient Location: PACU  Anesthesia Type:General  Level of Consciousness: awake, alert  and oriented  Airway and Oxygen Therapy: Patient Spontanous Breathing  Post-op Pain: mild  Post-op Assessment: Post-op Vital signs reviewed, Patient's Cardiovascular Status Stable, Respiratory Function Stable, Patent Airway, No signs of Nausea or vomiting and Pain level controlled  Post-op Vital Signs: Reviewed and stable  Last Vitals:  Filed Vitals:   06/11/13 1600  BP: 92/53  Pulse: 74  Temp:   Resp: 15    Complications: No apparent anesthesia complications

## 2013-06-11 NOTE — Op Note (Signed)
06/11/2013  3:10 PM  PATIENT:  Marilyn Bryant  33 y.o. female  PRE-OPERATIVE DIAGNOSIS:  Right Ovarian Cyst, Pelvic Pain  POST-OPERATIVE DIAGNOSIS:  Right ovarian cyst,pelvic pain, left paraovarian cyst  PROCEDURE:  Procedure(s): ROBOTIC ASSISTED LAPAROSCOPIC  RIGHT OVARIAN CYSTECTOMY AND REMOVAL OF LEFT PARAOVARIAN CYST  SURGEON:  Surgeon(s): Princess Bruins, MD  ASSISTANTS: Hester Mates   ANESTHESIA:   general  PROCEDURE:  The general anesthesia with endotracheal intubation the patient is in lithotomy position. She is prepped with ChloraPrep on the abdomen and with Betadine on the suprapubic, vulvar and vaginal areas. She is draped as usual. The vaginal exam revealed an anteverted uterus normal volume, normal left ovary and a right ovarian mass about 7-8 cm.  The speculum is inserted in the vagina and on the uterus is cannulated with the one tooth uterine cannula.  The Mirena IUD is in good intrauterine position with the strings visualized.  The speculum is removed. The Foley is inserted in the bladder.  We go to the abdomen. The supraumbilical area is infiltrated with Marcaine one quarter plain. We make a 1.5 cm incision at that level. The aponeurosis is opened with Mayo scissors. The peritoneum is opened bluntly with a finger.  A pursestring stitch of Vicryl 0 is done on the aponeurosis.  The Sheryle Hail is inserted at that level and a pneumoperitoneum is created with CO2.  The camera is inserted.  The uterus is normal in size and appearance with 2 normal tubes with normal fimbriae.  The left ovary is normal in size and appearance a small paratubal cyst is present on that side. On the right side a large right ovarian cyst is present measuring about 7-8 cm.  No other pathology is present in the pelvis. The appendix is normal to inspection. The liver is also normal to inspection. No abdominal pathology is seen.  Pictures were taken of all structures.  We used a semicircular port configuration.   Marcaine was infiltrated at each site.  Incisions were made with a scalpel.  2 robotic ports were inserted under direct vision on the left side and one robotic port was inserted on the right side.  The robot was docked from the left side.  The instruments were inserted with the Endo Shears scissor in the first arm, the PK in the second arm and the fenestrated clamp in the third arm.  We go to the console.  We make an incision with the tip of the Endo Shears scissor in the right ovary opposite the mesosalpinx.  The right ovarian cyst was punctured and a very clear watery fluid escaped.  We used traction and countertraction to remove the right ovarian cyst wall completely.  The Endo Shears scissors were replaced by the long tip clamp for that process.  The right ovarian cyst wall was put in the posterior cul-de-sac.  We completed hemostasis on the right ovary with the PK.  Irrigation and suction of the right ovary was done with the Nezhat through the third robotic port.  Hemostasis was found adequate at all levels.  We brought back the [employer].  We cauterized and section the left paraovarian cyst.  It was removed through the robotic port and sent to pathology.  All robotic instruments were removed. We then undocked the robot.  We went by laparoscopy.  Using an 8.5 mm camera, the right ovarian cyst wall was removed through the supraumbilical port.  It was sent together with the left paratubal cyst to  pathology.  Were irrigated and suctioned the abdominopelvic cavities. Hemostasis was confirmed once more.  Laparoscopy instruments were removed. The ports were removed under direct vision.  The CO2 was evacuated.  We closed the supraumbilical incision by attaching the pursestring stitch on the aponeurosis.  We then closed all incisions with a subcuticular stitch of Vicryl 4-0.  Dermabond was added on all incisions. Dressings were added as well.  The Foley was removed from the bladder. The cannula was removed  from the uterus.  The patient was brought to recovery room in good and stable status.  ESTIMATED BLOOD LOSS:  25 cc   Intake/Output Summary (Last 24 hours) at 06/11/13 1510 Last data filed at 06/11/13 1504  Gross per 24 hour  Intake   2000 ml  Output     75 ml  Net   1925 ml     BLOOD ADMINISTERED:none   LOCAL MEDICATIONS USED:  MARCAINE     SPECIMEN:  Source of Specimen:  Right ovarian cyst wall and left paraovarian cyst  DISPOSITION OF SPECIMEN:  PATHOLOGY  COUNTS:  YES  PLAN OF CARE: Transfer to PACU  Princess Bruins MD  06/11/2013 at 3:10 pm

## 2013-06-11 NOTE — Anesthesia Preprocedure Evaluation (Addendum)
Anesthesia Evaluation  Patient identified by MRN, date of birth, ID band Patient awake    Reviewed: Allergy & Precautions, H&P , Patient's Chart, lab work & pertinent test results, reviewed documented beta blocker date and time   Airway Mallampati: II TM Distance: >3 FB Neck ROM: full    Dental no notable dental hx.    Pulmonary  breath sounds clear to auscultation  Pulmonary exam normal       Cardiovascular Rhythm:regular Rate:Normal     Neuro/Psych    GI/Hepatic   Endo/Other    Renal/GU      Musculoskeletal   Abdominal   Peds  Hematology   Anesthesia Other Findings   Reproductive/Obstetrics                           Anesthesia Physical Anesthesia Plan  ASA: II  Anesthesia Plan:    Post-op Pain Management:    Induction: Intravenous  Airway Management Planned: Oral ETT  Additional Equipment:   Intra-op Plan:   Post-operative Plan:   Informed Consent: I have reviewed the patients History and Physical, chart, labs and discussed the procedure including the risks, benefits and alternatives for the proposed anesthesia with the patient or authorized representative who has indicated his/her understanding and acceptance.   Dental Advisory Given and Dental advisory given  Plan Discussed with: CRNA and Surgeon  Anesthesia Plan Comments: (Discussed GA , possible sore throat, N/V, pulmonary aspiration. Questions answered. )       Anesthesia Quick Evaluation

## 2013-06-11 NOTE — Discharge Summary (Signed)
  Physician Discharge Summary  Patient ID: Marilyn Bryant MRN: [id number] DOB/AGE: 34-25-1982 34 y.o.  Admit date: 06/11/2013 Discharge date: 06/11/2013  Admission Diagnoses: Right Ovarian Cyst, Pelvic Pain  Discharge Diagnoses: Right Ovarian Cyst, Pelvic Pain, left paratubal cyst        Active Problems:   * No active hospital problems. *   Discharged Condition: good  Hospital Course: good  Consults: None  Treatments: surgery: Robotic right ovarian cystectomy and removal of left paratubal cyst  Disposition: 01-Home or Self Care     Medication List         azithromycin 250 MG tablet  Commonly known as:  ZITHROMAX  Take 250 mg by mouth daily. Z-Pack 500mg , then 250mg  for 4 days.     ibuprofen 200 MG tablet  Commonly known as:  ADVIL,MOTRIN  Take 400 mg by mouth every 6 (six) hours as needed for headache.     oxyCODONE-acetaminophen 7.5-325 MG per tablet  Commonly known as:  PERCOCET  Take 1 tablet by mouth every 4 (four) hours as needed for pain.           Follow-up Information   Follow up with Denesia Donelan,MARIE-LYNE, MD In 3 weeks.   Specialty:  Obstetrics and Gynecology   Contact information:   [address] [id number] [phone]       Signed: Princess Bruins, MD 06/11/2013, 3:23 PM

## 2013-06-11 NOTE — Transfer of Care (Signed)
Immediate Anesthesia Transfer of Care Note  Patient: Marilyn Bryant  Procedure(s) Performed: Procedure(s) with comments: ROBOTIC ASSISTED LAPAROSCOPIC  RIGHT OVARIAN CYSTECTOMY (Right) - 90 min.  Patient Location: PACU  Anesthesia Type:General  Level of Consciousness: sedated  Airway & Oxygen Therapy: Patient Spontanous Breathing and Patient connected to nasal cannula oxygen  Post-op Assessment: Report given to PACU RN and Post -op Vital signs reviewed and stable  Post vital signs: stable  Complications: No apparent anesthesia complications

## 2013-06-11 NOTE — Discharge Instructions (Addendum)
DO NOT TAKE IBUPROFEN (ADVIL, ALEVE OR MOTRIN) TILL AFTER 8:45 PM!  Ovarian Cystectomy, Care After  Refer to this sheet in the next few weeks. These instructions provide you with information on caring for yourself after your procedure. Your health care provider may also give you more specific instructions. Your treatment has been planned according to current medical practices, but problems sometimes occur. Call your health care provider if you have any problems or questions after your procedure.   WHAT TO EXPECT AFTER THE PROCEDURE After your procedure, it is typical to have the following:  Pain in your abdomen, especially at the incision sites. You will be given pain medicines to control the pain.  Tiredness. This is a normal part of the recovery process. Your energy level will return to normal over the next several weeks.  Constipation.  HOME CARE INSTRUCTIONS  Only take over-the-counter or prescription medicines as directed by your health care provider. Avoid taking aspirin because it can cause bleeding.   Follow your health care provider's instructions for when to resume your regular diet, exercise, and activities.   Take rest breaks during the day as needed.  Do not douche or have sexual intercourse until you have permission from your health care provider.   Remove or change any bandages (dressings) as directed by your health care provider.   Do not drive until your health care provider approves.   Take showers instead of baths until your health care provider tells you otherwise.   If you become constipated, you may:   Use a mild laxative if your health care provider approves.  Add more fruit and bran to your diet.  Drink more fluids.  Take your temperature twice a day and record it.   Do not drink alcohol while taking pain medicine.   Try to have someone home with you for the first 1 2 weeks to help with your household activities.   Follow up with your  health care provider as directed.  SEEK MEDICAL CARE IF:  You have a fever.   You feel sick to your stomach (nauseous) and throw up (vomit).   You have redness, swelling, or leakage of fluid at the incision site.   You have pain when you urinate or have blood in your urine.   You have a rash on your body.   You have pain or redness where the IV tube was inserted.   You have pain that is not relieved with medicine.   SEEK IMMEDIATE MEDICAL CARE IF:  You have chest pain or shortness of breath.   You feel dizzy or lightheaded.   You have increasing abdominal pain that is not relieved with medicines.   You have pain, swelling, or redness in your leg.   You see a yellowish white fluid (pus) coming from the incision.   Your incision is opening (edges not staying together).   Document Released: 10/11/2012 Document Reviewed: 08/02/2012

## 2013-06-11 NOTE — H&P (Signed)
Marilyn Bryant is an 33 y.o. female  G1P1  RP:  Rt simple ovarian cyst 7.1cm  Pertinent Gynecological History: Menses: flow is moderate Contraception: Mirena IUD Blood transfusions: none Sexually transmitted diseases: no past history Last pap: normal  OB History: G1P1 C/S   Menstrual History:  No LMP recorded. Patient is not currently having periods (Reason: IUD).    Past Medical History  Diagnosis Date  . Hearing difficulty of both ears     wears hearing aids    Past Surgical History  Procedure Laterality Date  . Cesarean section      2010    History reviewed. No pertinent family history.  Social History:  reports that she has never smoked. She does not have any smokeless tobacco history on file. She reports that she does not drink alcohol or use illicit drugs.  Allergies: No Known Allergies  Prescriptions prior to admission  Medication Sig Dispense Refill  . azithromycin (ZITHROMAX) 250 MG tablet Take 250 mg by mouth daily. Z-Pack 500mg , then 250mg  for 4 days.      Marland Kitchen ibuprofen (ADVIL,MOTRIN) 200 MG tablet Take 400 mg by mouth every 6 (six) hours as needed for headache.        ROS  Blood pressure 97/70, pulse 71, temperature 98.4 F (36.9 C), temperature source Oral, resp. rate 16, height 5\' 4"  (1.626 m), weight 60.782 kg (134 lb), SpO2 100.00%. Physical Exam  Results for orders placed during the hospital encounter of 06/11/13 (from the past 24 hour(s))  CBC     Status: None   Collection Time    06/11/13 12:04 PM      Result Value Ref Range   WBC 6.0  4.0 - 10.5 K/uL   RBC 4.61  3.87 - 5.11 MIL/uL   Hemoglobin 14.2  12.0 - 15.0 g/dL   HCT 41.7  36.0 - 46.0 %   MCV 90.5  78.0 - 100.0 fL   MCH 30.8  26.0 - 34.0 pg   MCHC 34.1  30.0 - 36.0 g/dL   RDW 12.7  11.5 - 15.5 %   Platelets 254  150 - 400 K/uL  PREGNANCY, URINE     Status: None   Collection Time    06/11/13 12:07 PM      Result Value Ref Range   Preg Test, Ur NEGATIVE  NEGATIVE    No results  found.  Assessment/Plan: Rt ovarian simple cyst 7.1 cm. Planning to attempt conception in near future.  Robotic Rt ovarian cystectomy.  Surgery and risks reviewed.  Marilyn Bryant 06/11/2013, 12:58 PM

## 2013-06-12 ENCOUNTER — Encounter (HOSPITAL_COMMUNITY): Payer: Self-pay | Admitting: Obstetrics & Gynecology

## 2017-04-23 ENCOUNTER — Other Ambulatory Visit: Payer: Self-pay | Admitting: Obstetrics & Gynecology

## 2017-05-06 DIAGNOSIS — Z01419 Encounter for gynecological examination (general) (routine) without abnormal findings: Secondary | ICD-10-CM | POA: Diagnosis not present

## 2017-05-06 DIAGNOSIS — Z6822 Body mass index (BMI) 22.0-22.9, adult: Secondary | ICD-10-CM | POA: Diagnosis not present

## 2017-05-13 DIAGNOSIS — Z1329 Encounter for screening for other suspected endocrine disorder: Secondary | ICD-10-CM | POA: Diagnosis not present

## 2017-05-13 DIAGNOSIS — Z13220 Encounter for screening for lipoid disorders: Secondary | ICD-10-CM | POA: Diagnosis not present

## 2017-05-13 DIAGNOSIS — Z0000 Encounter for general adult medical examination without abnormal findings: Secondary | ICD-10-CM | POA: Diagnosis not present

## 2017-10-28 DIAGNOSIS — H903 Sensorineural hearing loss, bilateral: Secondary | ICD-10-CM | POA: Diagnosis not present

## 2018-05-31 DIAGNOSIS — Z03818 Encounter for observation for suspected exposure to other biological agents ruled out: Secondary | ICD-10-CM | POA: Diagnosis not present

## 2018-07-24 DIAGNOSIS — Z01419 Encounter for gynecological examination (general) (routine) without abnormal findings: Secondary | ICD-10-CM | POA: Diagnosis not present

## 2018-07-24 DIAGNOSIS — Z6822 Body mass index (BMI) 22.0-22.9, adult: Secondary | ICD-10-CM | POA: Diagnosis not present

## 2018-07-24 DIAGNOSIS — Z1151 Encounter for screening for human papillomavirus (HPV): Secondary | ICD-10-CM | POA: Diagnosis not present

## 2018-08-08 DIAGNOSIS — M542 Cervicalgia: Secondary | ICD-10-CM | POA: Diagnosis not present

## 2018-08-08 DIAGNOSIS — Z1389 Encounter for screening for other disorder: Secondary | ICD-10-CM | POA: Diagnosis not present

## 2018-08-08 DIAGNOSIS — H6992 Unspecified Eustachian tube disorder, left ear: Secondary | ICD-10-CM | POA: Diagnosis not present

## 2018-08-08 DIAGNOSIS — Z6821 Body mass index (BMI) 21.0-21.9, adult: Secondary | ICD-10-CM | POA: Diagnosis not present

## 2018-08-09 ENCOUNTER — Other Ambulatory Visit: Payer: Self-pay | Admitting: Physician Assistant

## 2018-08-09 ENCOUNTER — Other Ambulatory Visit (HOSPITAL_COMMUNITY): Payer: Self-pay | Admitting: Physician Assistant

## 2018-08-09 DIAGNOSIS — M542 Cervicalgia: Secondary | ICD-10-CM

## 2018-08-09 DIAGNOSIS — H6992 Unspecified Eustachian tube disorder, left ear: Secondary | ICD-10-CM

## 2018-08-10 DIAGNOSIS — R946 Abnormal results of thyroid function studies: Secondary | ICD-10-CM | POA: Diagnosis not present

## 2018-08-11 ENCOUNTER — Ambulatory Visit (HOSPITAL_COMMUNITY)
Admission: RE | Admit: 2018-08-11 | Discharge: 2018-08-11 | Disposition: A | Discharge status: Home / Self Care | Payer: BC Managed Care – PPO | Source: Ambulatory Visit | Attending: Physician Assistant | Admitting: Physician Assistant

## 2018-08-11 ENCOUNTER — Other Ambulatory Visit: Payer: Self-pay

## 2018-08-11 DIAGNOSIS — E042 Nontoxic multinodular goiter: Secondary | ICD-10-CM | POA: Diagnosis not present

## 2018-08-11 DIAGNOSIS — H6992 Unspecified Eustachian tube disorder, left ear: Secondary | ICD-10-CM | POA: Insufficient documentation

## 2018-08-11 DIAGNOSIS — M542 Cervicalgia: Secondary | ICD-10-CM | POA: Diagnosis not present

## 2018-08-17 ENCOUNTER — Other Ambulatory Visit: Payer: Self-pay

## 2018-08-17 ENCOUNTER — Other Ambulatory Visit: Payer: Self-pay | Admitting: "Endocrinology

## 2018-08-17 ENCOUNTER — Ambulatory Visit: Payer: BC Managed Care – PPO | Admitting: "Endocrinology

## 2018-08-17 ENCOUNTER — Encounter: Payer: Self-pay | Admitting: "Endocrinology

## 2018-08-17 VITALS — BP 110/73 | HR 109 | Ht 65.0 in | Wt 129.0 lb

## 2018-08-17 DIAGNOSIS — E0590 Thyrotoxicosis, unspecified without thyrotoxic crisis or storm: Secondary | ICD-10-CM

## 2018-08-17 MED ORDER — PROPRANOLOL HCL 20 MG PO TABS
20.0000 mg | ORAL_TABLET | Freq: Two times a day (BID) | ORAL | 2 refills | Status: DC
Start: 2018-08-17 — End: 2018-08-22

## 2018-08-17 NOTE — Progress Notes (Signed)
Endocrinology Consult Note    Subjective:    Patient ID: DALEYZA GADOMSKI, female    DOB: 12/26/1980, PCP Sharilyn Sites, MD.   Past Medical History:  Diagnosis Date  . Hearing difficulty of both ears    wears hearing aids    Past Surgical History:  Procedure Laterality Date  . CESAREAN SECTION     2010  . ROBOTIC ASSISTED LAPAROSCOPIC OVARIAN CYSTECTOMY Right 06/11/2013   Procedure: ROBOTIC ASSISTED LAPAROSCOPIC  RIGHT OVARIAN CYSTECTOMY;  Surgeon: Princess Bruins, MD;  Location: [facility];  Service: Gynecology;  Laterality: Right;  90 min.    Social History   Socioeconomic History  . Marital status: Married    Spouse name: Not on file  . Number of children: Not on file  . Years of education: Not on file  . Highest education level: Not on file  Occupational History  . Not on file  Social Needs  . Financial resource strain: Not on file  . Food insecurity    Worry: Not on file    Inability: Not on file  . Transportation needs    Medical: Not on file    Non-medical: Not on file  Tobacco Use  . Smoking status: Never Smoker  Substance and Sexual Activity  . Alcohol use: No  . Drug use: No  . Sexual activity: Not on file  Lifestyle  . Physical activity    Days per week: Not on file    Minutes per session: Not on file  . Stress: Not on file  Relationships  . Social Herbalist on phone: Not on file    Gets together: Not on file    Attends religious service: Not on file    Active member of club or organization: Not on file    Attends meetings of clubs or organizations: Not on file    Relationship status: Not on file  Other Topics Concern  . Not on file  Social History Narrative  . Not on file    Family History  Problem Relation Age of Onset  . Hypertension Father     Outpatient Encounter Medications as of 08/17/2018  Medication Sig  . cetirizine (ZYRTEC) 10 MG tablet Take 10 mg by mouth daily.  . norethindrone-ethinyl estradiol (LOESTRIN FE)  1-20 MG-MCG tablet Take 1 tablet by mouth daily.  . propranolol (INDERAL) 20 MG tablet Take 1 tablet (20 mg total) by mouth 2 (two) times daily.  . [DISCONTINUED] azithromycin (ZITHROMAX) 250 MG tablet Take 250 mg by mouth daily. Z-Pack 500mg , then 250mg  for 4 days.  . [DISCONTINUED] ibuprofen (ADVIL,MOTRIN) 200 MG tablet Take 400 mg by mouth every 6 (six) hours as needed for headache.  . [DISCONTINUED] oxyCODONE-acetaminophen (PERCOCET) 7.5-325 MG per tablet Take 1 tablet by mouth every 4 (four) hours as needed for pain.   No facility-administered encounter medications on file as of 08/17/2018.     ALLERGIES: No Known Allergies  VACCINATION STATUS:  There is no immunization history on file for this patient.   HPI  Marilyn Bryant is 38 y.o. female who presents today with a medical history as above. she is being seen in consultation for hyperthyroidism requested by Sharilyn Sites, MD.  she has been dealing with symptoms of weight loss of 6 pounds, palpitations, heat intolerance/sweating, tremors, anxiety for several weeks. -Recent lab work showed significantly suppressed TSH 0.01, and thyroid sonogram confirmed multinodular goiter. she reports occasional , positional dysphagia, choking,  no recent voice change. These  symptoms are progressively worsening and troubling to her.   she has family history of thyroid dysfunction in her further.  Follow her sisters has hypothyroidism on Synthroid therapy.  She denies family history of thyroid malignancy.  - she is not on any anti-thyroid medications nor on any thyroid hormone supplements. she  is willing to proceed with appropriate work up and therapy for thyrotoxicosis.                           Review of systems  Constitutional: + weight loss, + fatigue, + subjective hyperthermia Eyes: no blurry vision, + xerophthalmia ENT: no sore throat, no nodules palpated in throat, no dysphagia/odynophagia, nor hoarseness Cardiovascular: no Chest Pain,  no Shortness of Breath, ++  palpitations, no leg swelling Respiratory: no cough, no SOB Gastrointestinal: no Nausea, no Vomiting, no Diarhhea Musculoskeletal: no muscle/joint aches Skin: no rashes Neurological: ++  tremors, no numbness, no tingling, no dizziness Psychiatric: no depression, ++  anxiety   Objective:    BP 110/73   Pulse (!) 109   Ht 5\' 5"  (1.651 m)   Wt 129 lb (58.5 kg)   BMI 21.47 kg/m   Wt Readings from Last 3 Encounters:  08/17/18 129 lb (58.5 kg)  06/11/13 134 lb (60.8 kg)  03/16/13 131 lb (59.4 kg)                                                Physical exam  Constitutional: + light weight for height, not in acute distress, ++ anxious state of mind Eyes: PERRLA, EOMI, - exophthalmos ENT: moist mucous membranes, ++ nodular  thyromegaly, no cervical lymphadenopathy Cardiovascular: ++ precordial activity, ++ tachycardic at 109, no Murmur/Rubs/Gallops Respiratory:  adequate breathing efforts, no gross chest deformity, Clear to auscultation bilaterally Gastrointestinal: abdomen soft, Non -tender, No distension, Bowel Sounds present Musculoskeletal: no gross deformities, strength intact in all four extremities Skin: moist, warm, no rashes Neurological: ++  tremor with outstretched hands,  ++ Deep Tendon Reflexes  on both lower extremities.   CMP     Component Value Date/Time   NA 139 03/16/2013 1353   K 4.1 03/16/2013 1353   CL 99 03/16/2013 1353   CO2 27 03/16/2013 1353   GLUCOSE 143 (H) 03/16/2013 1353   BUN 14 03/16/2013 1353   CREATININE 0.66 03/16/2013 1353   CALCIUM 9.3 03/16/2013 1353   PROT 5.3 (L) 10/23/2008 1058   ALBUMIN 2.2 (L) 10/23/2008 1058   AST 54 (H) 10/23/2008 1058   ALT 25 10/23/2008 1058   ALKPHOS 156 (H) 10/23/2008 1058   BILITOT 0.4 10/23/2008 1058   GFRNONAA >90 03/16/2013 1353   GFRAA >90 03/16/2013 1353     CBC    Component Value Date/Time   WBC 6.0 06/11/2013 1204   RBC 4.61 06/11/2013 1204   HGB 14.2 06/11/2013  1204   HCT 41.7 06/11/2013 1204   PLT 254 06/11/2013 1204   MCV 90.5 06/11/2013 1204   MCH 30.8 06/11/2013 1204   MCHC 34.1 06/11/2013 1204   RDW 12.7 06/11/2013 1204    TSH on August 10, 2018 was suppressed at 0.071   Thyroid ultrasound from August 11, 2018:  IMPRESSION: 1. Mild thyromegaly with bilateral nodules. 2. Recommend FNA biopsy of moderately suspicious 3.9 cm mid left nodule. 3. Recommend annual/biennial ultrasound follow-up  of right nodules as above, until stability x5 years confirmed.   Assessment & Plan:   1. Hyperthyroidism she is being seen at a kind request of Sharilyn Sites, MD. her history and most recent labs are reviewed, and she was examined clinically. Subjective and objective findings are consistent with thyrotoxicosis likely from primary hyperthyroidism. The potential risks of untreated thyrotoxicosis and the need for definitive therapy have been discussed in detail with her, and she agrees to proceed with diagnostic workup and treatment plan.   I like to repeat full profile thyroid function tests today and confirmatory thyroid uptake and scan will be scheduled to be done as soon as possible.   Options of therapy are briefly discussed with her.  If primary hyperthyroidism is confirmed, she will be considered for I-131 thyroid ablation.  If her thyroid uptake and scan shows significant photopenic nodule (3.9 cm firm nodule in the left lobe of her thyroid), she will be considered for fine-needle aspiration before thyroid ablation.     I will initiate a temporary prescription for  Propranolol  20 mg po BID for symptomatic relief.  - I advised her to maintain close follow up with Sharilyn Sites, MD for primary care needs.   - Time spent with the patient: 45 minutes, of which >50% was spent in obtaining information about her symptoms, reviewing her previous labs, evaluations, and treatments, counseling her about her hyperthyroidism, multinodular goiter, and  developing a plan to confirm the diagnosis and long term treatment as necessary. Please refer to " Patient Self Inventory" in the Media  tab for reviewed elements of pertinent patient history.  Governor Rooks participated in the discussions, expressed understanding, and voiced agreement with the above plans.  All questions were answered to her satisfaction. she is encouraged to contact clinic should she have any questions or concerns prior to her return visit.   Follow up plan: Return in about 10 days (around 08/27/2018) for Labs Today- Non-Fasting Ok, Follow up with Thyroid Uptake and Scan.   Thank you for involving me in the care of this pleasant patient, and I will continue to update you with her progress.  Glade Lloyd, MD [facility] Phone: [phone]  Fax: [phone]   08/17/2018, 5:49 PM  This note was partially dictated with voice recognition software. Similar sounding words can be transcribed inadequately or may not  be corrected upon review.

## 2018-08-22 ENCOUNTER — Telehealth: Payer: Self-pay

## 2018-08-22 ENCOUNTER — Other Ambulatory Visit: Payer: Self-pay | Admitting: "Endocrinology

## 2018-08-22 MED ORDER — PREDNISONE 20 MG PO TABS: 20.0000 mg | ORAL_TABLET | Freq: Every day | ORAL | 0 refills | Status: DC

## 2018-08-22 MED ORDER — PROPRANOLOL HCL 40 MG PO TABS: 40.0000 mg | ORAL_TABLET | Freq: Three times a day (TID) | ORAL | 1 refills | Status: DC

## 2018-08-22 NOTE — Telephone Encounter (Signed)
I spoke with her. She will increase Propranolol to 40mg  po TID , and I will send a rx for Prednisone 20mg  po qam x 20 days, and I want her back on Aug 24, 2018.

## 2018-08-22 NOTE — Telephone Encounter (Signed)
Pt rescheduled by front desk. 

## 2018-08-22 NOTE — Telephone Encounter (Signed)
Please call regarding heart rate + due to meds [phone]

## 2018-08-22 NOTE — Telephone Encounter (Signed)
Pt starts the Uptake scan tomorrow. She started the propranolol on 8/13. She states her HR is going up to 140-150 at least twice a day. States it normally is in 80's or 90's. She complains of headaches also. Anything she can do to help.

## 2018-08-23 ENCOUNTER — Ambulatory Visit (HOSPITAL_COMMUNITY)
Admission: RE | Admit: 2018-08-23 | Discharge: 2018-08-23 | Disposition: A | Discharge status: Home / Self Care | Payer: BC Managed Care – PPO | Source: Ambulatory Visit | Attending: "Endocrinology | Admitting: "Endocrinology

## 2018-08-23 ENCOUNTER — Encounter (HOSPITAL_COMMUNITY): Payer: Self-pay

## 2018-08-23 ENCOUNTER — Other Ambulatory Visit: Payer: Self-pay

## 2018-08-23 DIAGNOSIS — E0590 Thyrotoxicosis, unspecified without thyrotoxic crisis or storm: Secondary | ICD-10-CM | POA: Insufficient documentation

## 2018-08-23 MED ORDER — SODIUM IODIDE I-123 7.4 MBQ CAPS
500.0000 | ORAL_CAPSULE | Freq: Once | ORAL | Status: AC
  Administered 2018-08-23: 442 via ORAL

## 2018-08-24 ENCOUNTER — Other Ambulatory Visit: Payer: Self-pay | Admitting: "Endocrinology

## 2018-08-24 ENCOUNTER — Ambulatory Visit: Payer: BC Managed Care – PPO | Admitting: "Endocrinology

## 2018-08-24 ENCOUNTER — Telehealth: Payer: Self-pay

## 2018-08-24 ENCOUNTER — Ambulatory Visit (HOSPITAL_COMMUNITY)
Admission: RE | Admit: 2018-08-24 | Discharge: 2018-08-24 | Disposition: A | Discharge status: Home / Self Care | Payer: BC Managed Care – PPO | Source: Ambulatory Visit | Attending: "Endocrinology | Admitting: "Endocrinology

## 2018-08-24 ENCOUNTER — Other Ambulatory Visit: Payer: Self-pay

## 2018-08-24 DIAGNOSIS — E0590 Thyrotoxicosis, unspecified without thyrotoxic crisis or storm: Secondary | ICD-10-CM | POA: Diagnosis not present

## 2018-08-24 DIAGNOSIS — E041 Nontoxic single thyroid nodule: Secondary | ICD-10-CM

## 2018-08-24 NOTE — Telephone Encounter (Signed)
Biopsy ordered. Pt notified.

## 2018-08-24 NOTE — Telephone Encounter (Signed)
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 She does not have to come today.  I want her to have  FNA biopsy of that 3.9 cm nodule in her thyroid.  She would continue on prednisone and propranolol.  We will see her after her biopsy results.

## 2018-08-25 LAB — TSH+T3+FREE T4+T3 FREE
Free T-3: 9.6 pg/mL — ABNORMAL HIGH
Free T4 by Dialysis: 4 ng/dL
TSH: 0.01 uU/mL — ABNORMAL LOW
Triiodothyronine (T-3), Serum: 435 ng/dL — ABNORMAL HIGH

## 2018-08-25 LAB — THYROGLOBULIN ANTIBODY: Thyroglobulin Antibody: <1 IU/mL (ref 0.0–0.9)

## 2018-08-25 LAB — THYROID PEROXIDASE ANTIBODY: Thyroperoxidase Ab SerPl-aCnc: 34 IU/mL (ref 0–34)

## 2018-08-29 ENCOUNTER — Ambulatory Visit (HOSPITAL_COMMUNITY): Admission: RE | Admit: 2018-08-29 | Payer: BC Managed Care – PPO | Source: Ambulatory Visit

## 2018-08-29 ENCOUNTER — Ambulatory Visit: Payer: BC Managed Care – PPO | Admitting: "Endocrinology

## 2018-08-31 ENCOUNTER — Encounter (HOSPITAL_COMMUNITY): Payer: Self-pay

## 2018-08-31 ENCOUNTER — Other Ambulatory Visit: Payer: Self-pay

## 2018-08-31 ENCOUNTER — Ambulatory Visit (HOSPITAL_COMMUNITY)
Admission: RE | Admit: 2018-08-31 | Discharge: 2018-08-31 | Disposition: A | Discharge status: Home / Self Care | Payer: BC Managed Care – PPO | Source: Ambulatory Visit | Attending: "Endocrinology | Admitting: "Endocrinology

## 2018-08-31 DIAGNOSIS — D440 Neoplasm of uncertain behavior of thyroid gland: Secondary | ICD-10-CM | POA: Diagnosis not present

## 2018-08-31 DIAGNOSIS — E041 Nontoxic single thyroid nodule: Secondary | ICD-10-CM

## 2018-08-31 MED ORDER — LIDOCAINE HCL (PF) 2 % IJ SOLN
INTRAMUSCULAR | Status: AC
  Administered 2018-08-31: 10 mL
  Filled 2018-08-31: qty 10

## 2018-08-31 NOTE — Discharge Instructions (Signed)
Thyroid Needle Biopsy, Care After °This sheet gives you information about how to care for yourself after your procedure. Your health care provider may also give you more specific instructions. If you have problems or questions, contact your health care provider. °What can I expect after the procedure? °After the procedure, it is common to have: °· Soreness and tenderness that lasts for a few days. °· Bruising where the needle was inserted (puncture site). °Follow these instructions at home: ° °· Take over-the-counter and prescription medicines only as told by your health care provider. °· To help ease discomfort, keep your head raised (elevated) when you are lying down. When you move from lying down to sitting up, use both hands to support the back of your head and neck. °· Check your puncture site every day for signs of infection. Check for: °? Redness, swelling, or pain. °? Fluid or blood. °? Warmth. °? Pus or a bad smell. °· Return to your normal activities as told by your health care provider. Ask your health care provider what activities are safe for you. °· Keep all follow-up visits as told by your health care provider. This is important. °Contact a health care provider if: °· You have redness, swelling, or pain around your puncture site. °· You have fluid or blood coming from your puncture site. °· Your puncture site feels warm to the touch. °· You have pus or a bad smell coming from your puncture site. °· You have a fever. °Get help right away if: °· You have severe bleeding from the puncture site. °· You have difficulty swallowing. °· You have swollen glands (lymph nodes) in your neck. °Summary °· It is common to have some bruising and soreness where the needle was inserted in your lower front neck area (puncture site). °· Check your puncture site every day for signs of infection, such as redness, swelling, or pain. °· Get help right away if you have severe bleeding from your puncture site. °This  information is not intended to replace advice given to you by your health care provider. Make sure you discuss any questions you have with your health care provider. °Document Released: 07/18/2013 Document Revised: 12/03/2016 Document Reviewed: 10/04/2016 °Elsevier Patient Education © 2020 [employer] ° °

## 2018-08-31 NOTE — Sedation Documentation (Signed)
PT tolerated thyroid needle biopsy well with NAD noted.

## 2018-09-04 ENCOUNTER — Encounter: Payer: Self-pay | Admitting: "Endocrinology

## 2018-09-04 ENCOUNTER — Other Ambulatory Visit: Payer: Self-pay

## 2018-09-04 ENCOUNTER — Ambulatory Visit: Payer: BC Managed Care – PPO | Admitting: "Endocrinology

## 2018-09-04 VITALS — BP 100/66 | HR 76 | Ht 65.0 in | Wt 130.0 lb

## 2018-09-04 DIAGNOSIS — E061 Subacute thyroiditis: Secondary | ICD-10-CM | POA: Diagnosis not present

## 2018-09-04 MED ORDER — PROPRANOLOL HCL 40 MG PO TABS: 40.0000 mg | ORAL_TABLET | Freq: Three times a day (TID) | ORAL | 1 refills | Status: DC

## 2018-09-04 NOTE — Progress Notes (Signed)
Endocrinology follow-up note   Subjective:    Patient ID: Marilyn Bryant, female    DOB: 1980/04/29, PCP Sharilyn Sites, MD.   Past Medical History:  Diagnosis Date  . Hearing difficulty of both ears    wears hearing aids    Past Surgical History:  Procedure Laterality Date  . CESAREAN SECTION     2010  . ROBOTIC ASSISTED LAPAROSCOPIC OVARIAN CYSTECTOMY Right 06/11/2013   Procedure: ROBOTIC ASSISTED LAPAROSCOPIC  RIGHT OVARIAN CYSTECTOMY;  Surgeon: Princess Bruins, MD;  Location: [facility];  Service: Gynecology;  Laterality: Right;  90 min.    Social History   Socioeconomic History  . Marital status: Married    Spouse name: Not on file  . Number of children: Not on file  . Years of education: Not on file  . Highest education level: Not on file  Occupational History  . Not on file  Social Needs  . Financial resource strain: Not on file  . Food insecurity    Worry: Not on file    Inability: Not on file  . Transportation needs    Medical: Not on file    Non-medical: Not on file  Tobacco Use  . Smoking status: Never Smoker  Substance and Sexual Activity  . Alcohol use: No  . Drug use: No  . Sexual activity: Not on file  Lifestyle  . Physical activity    Days per week: Not on file    Minutes per session: Not on file  . Stress: Not on file  Relationships  . Social Herbalist on phone: Not on file    Gets together: Not on file    Attends religious service: Not on file    Active member of club or organization: Not on file    Attends meetings of clubs or organizations: Not on file    Relationship status: Not on file  Other Topics Concern  . Not on file  Social History Narrative  . Not on file    Family History  Problem Relation Age of Onset  . Hypertension Father     Outpatient Encounter Medications as of 09/04/2018  Medication Sig  . cetirizine (ZYRTEC) 10 MG tablet Take 10 mg by mouth daily.  . norethindrone-ethinyl estradiol (LOESTRIN FE)  1-20 MG-MCG tablet Take 1 tablet by mouth daily.  . predniSONE (DELTASONE) 20 MG tablet Take 1 tablet (20 mg total) by mouth daily with breakfast.  . propranolol (INDERAL) 40 MG tablet Take 1 tablet (40 mg total) by mouth 3 (three) times daily.  . [DISCONTINUED] propranolol (INDERAL) 40 MG tablet Take 1 tablet (40 mg total) by mouth 3 (three) times daily.   No facility-administered encounter medications on file as of 09/04/2018.     ALLERGIES: No Known Allergies  VACCINATION STATUS:  There is no immunization history on file for this patient.   HPI  Marilyn Bryant is 38 y.o. female who presents today with a medical history as above. she is being seen in follow-up after she was seen in consultation for hypothyroidism requested by  Sharilyn Sites, MD.  -Her recent thyroid uptake and scan was not supportive for primary hypothyroidism, and rather supportive for subacute thyroiditis with transient thyrotoxicosis.  She has responded to low-dose of prednisone and beta-blocker.  She is not on antithyroid medication at this time.  She underwent fine-needle aspiration of a dominant nodule in the left lobe measuring 3.9 cm, yielding nondiagnostic material.  A sample was sent for Afirma  molecular studies.  Results are pending.  She is clinically improving symptom wise.  she believes her thyroid has decreased in size, denies dysphagia, change in voice, nor shortness of breath.     she has family history of thyroid dysfunction in her further.    her sisters have hypothyroidism on Synthroid therapy.  She denies family history of thyroid malignancy.                            Review of systems  Constitutional: - weight loss, + fatigue, + subjective hyperthermia Eyes: no blurry vision, + xerophthalmia ENT: no sore throat, no nodules palpated in throat, no dysphagia/odynophagia, nor hoarseness Cardiovascular: no Chest Pain, no Shortness of Breath, ++  palpitations, no leg swelling Respiratory: no cough,  no SOB Gastrointestinal: no Nausea, no Vomiting, no Diarhhea Musculoskeletal: no muscle/joint aches Skin: no rashes Neurological: +  tremors, no numbness, no tingling, no dizziness Psychiatric: no depression, +  anxiety   Objective:    BP 100/66   Pulse 76   Ht 5\' 5"  (1.651 m)   Wt 130 lb (59 kg)   LMP 08/23/2018   BMI 21.63 kg/m   Wt Readings from Last 3 Encounters:  09/04/18 130 lb (59 kg)  08/17/18 129 lb (58.5 kg)  06/11/13 134 lb (60.8 kg)                                                Physical exam  Constitutional: + light weight for height, not in acute distress, + anxious state of mind Eyes: PERRLA, EOMI, - exophthalmos ENT: moist mucous membranes, ++ nodular  thyromegaly, no cervical lymphadenopathy Cardiovascular: + precordial activity, - tachycardic, average pulse rate at 90, no Murmur/Rubs/Gallops Respiratory:  adequate breathing efforts, no gross chest deformity, Clear to auscultation bilaterally Gastrointestinal: abdomen soft, Non -tender, No distension, Bowel Sounds present Musculoskeletal: no gross deformities, strength intact in all four extremities Skin: moist, warm, no rashes Neurological: +  tremor with outstretched hands,  + Deep Tendon Reflexes  on both lower extremities.   CMP     Component Value Date/Time   NA 139 03/16/2013 1353   K 4.1 03/16/2013 1353   CL 99 03/16/2013 1353   CO2 27 03/16/2013 1353   GLUCOSE 143 (H) 03/16/2013 1353   BUN 14 03/16/2013 1353   CREATININE 0.66 03/16/2013 1353   CALCIUM 9.3 03/16/2013 1353   PROT 5.3 (L) 10/23/2008 1058   ALBUMIN 2.2 (L) 10/23/2008 1058   AST 54 (H) 10/23/2008 1058   ALT 25 10/23/2008 1058   ALKPHOS 156 (H) 10/23/2008 1058   BILITOT 0.4 10/23/2008 1058   GFRNONAA >90 03/16/2013 1353   GFRAA >90 03/16/2013 1353     CBC    Component Value Date/Time   WBC 6.0 06/11/2013 1204   RBC 4.61 06/11/2013 1204   HGB 14.2 06/11/2013 1204   HCT 41.7 06/11/2013 1204   PLT 254 06/11/2013 1204    MCV 90.5 06/11/2013 1204   MCH 30.8 06/11/2013 1204   MCHC 34.1 06/11/2013 1204   RDW 12.7 06/11/2013 1204    TSH on August 10, 2018 was suppressed at 0.071   Results for DANELIA, OSCEOLA (MRN [id number]) as of 09/04/2018 18:57  Ref. Range 08/17/2018 14:34  TSH Latest Units: uU/mL 0.01 (L)  Triiodothyronine (T-3), Serum  Latest Units: ng/dL 435 (H)  Free T-3 Latest Units: pg/mL 9.6 (H)  Free T4 by Dialysis Latest Units: ng/dL 4.0  Thyroperoxidase Ab SerPl-aCnc Latest Ref Range: 0 - 34 IU/mL 34  Thyroglobulin Antibody Latest Ref Range: 0.0 - 0.9 IU/mL <1.0    Thyroid ultrasound from August 11, 2018:  IMPRESSION: 1. Mild thyromegaly with bilateral nodules. 2. Recommend FNA biopsy of moderately suspicious 3.9 cm mid left nodule. 3. Recommend annual/biennial ultrasound follow-up of right nodules as above, until stability x5 years confirmed.  Thyroid uptake and scan on August 24, 2018  FINDINGS: Insufficient tracer uptake to assess thyroid morphology. 4 hour I-123 uptake = 0.8% (normal 5-20%) 24 hour I-123 uptake = 0.2% (normal 10-30%)  IMPRESSION: Markedly decreased 4 hour and 24 hour radio iodine uptakes. Insufficient tracer localization to assess thyroid morphology.   FNA Diagnosis-August 31, 2018 THYROID, FINE NEEDLE ASPIRATION, LEFT (SPECIMEN 1 OF 1, COLLECTED [id number]): SCANT FOLLICULAR EPITHELIUM PRESENT (BETHESDA CATEGORY I). NONDIAGNOSTIC MATERIAL.  Assessment & Plan:   1.  Subacute thyroiditis  2.  Multinodular goiter -I discussed only findings of her recent thyroid work-up including thyroid uptake and scan, FNA, and thyroid function tests.   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 -She is responding to low-dose prednisone as well as beta-blockers.  Based on the findings of thyroid function studies, she will not need ablative treatment for now. -We will be awaiting for her Afirma molecular study results.   She will be contacted if results are significantly abnormal for thyroid malignancy.  If findings are suspicious for confirmatory of thyroid malignancy, she will be considered for thyroidectomy. -If suggestive of benign findings, she will have repeat thyroid function tests in 9 weeks with clinic visit. -She is advised to lower her propranolol to 40 mg p.o. twice daily, advised to finish her current course of prednisone.   - I advised her to maintain close follow up with Sharilyn Sites, MD for primary care needs.   Time for this visit: 15 minutes. Governor Rooks  participated in the discussions, expressed understanding, and voiced agreement with the above plans.  All questions were answered to her satisfaction. she is encouraged to contact clinic should she have any questions or concerns prior to her return visit.  Follow up plan: Return in about 3 months (around 12/04/2018) for Follow up with Pre-visit Labs.   Thank you for involving me in the care of this pleasant patient, and I will continue to update you with her progress.  Glade Lloyd, MD [facility] Phone: [phone]  Fax: [phone]   09/04/2018, 6:46 PM  This note was partially dictated with voice recognition software. Similar sounding words can be transcribed inadequately or may not  be corrected upon review.

## 2018-09-18 ENCOUNTER — Telehealth: Payer: Self-pay | Admitting: "Endocrinology

## 2018-09-18 ENCOUNTER — Other Ambulatory Visit: Payer: Self-pay | Admitting: "Endocrinology

## 2018-09-18 MED ORDER — PREDNISONE 20 MG PO TABS: 20.0000 mg | ORAL_TABLET | Freq: Every day | ORAL | 0 refills | Status: DC

## 2018-09-18 NOTE — Telephone Encounter (Signed)
Routing to Dr Nida for Advice? 

## 2018-09-18 NOTE — Telephone Encounter (Signed)
I will send a refill on her prednisone.

## 2018-09-18 NOTE — Telephone Encounter (Signed)
Left voicemail for patient

## 2018-09-18 NOTE — Telephone Encounter (Signed)
Pt left a VM that she was just diagnosed with thyroiditis and that she just finished her prednisone about 10-11 day ago & now her other side is starting to hurt and tender to touch. Please Advise

## 2018-10-04 ENCOUNTER — Telehealth: Payer: Self-pay | Admitting: "Endocrinology

## 2018-10-04 NOTE — Telephone Encounter (Signed)
Pt.notified

## 2018-10-04 NOTE — Telephone Encounter (Signed)
I do not see her Afirma Molecular study results yet, lets give another 1-2 weeks time.

## 2018-10-04 NOTE — Telephone Encounter (Signed)
Pt is requesting Thyroid bx results. Appt not til Dec 2020

## 2018-10-24 ENCOUNTER — Telehealth: Payer: Self-pay | Admitting: "Endocrinology

## 2018-10-24 DIAGNOSIS — E041 Nontoxic single thyroid nodule: Secondary | ICD-10-CM

## 2018-10-24 NOTE — Telephone Encounter (Signed)
Pathology took pts info. They will call in the a.m.

## 2018-10-24 NOTE — Telephone Encounter (Signed)
Marilyn Bryant, She is waiting for her molecular study results.  I do not see any report yet.  Typically, they send it back to whoever sent it out.  Can you verify with radiology?

## 2018-10-24 NOTE — Telephone Encounter (Signed)
Pt calling for thyroid biopsy results

## 2018-10-25 NOTE — Telephone Encounter (Signed)
Pathology states that no molecular studies were done. She states no cell block was able to be obtained to send.

## 2018-10-25 NOTE — Telephone Encounter (Signed)
lft msg for pt to call us back in the am. Did get confirmation that the cells sent for molecular studies were insufficient for testing. Marland Kitchen

## 2018-10-25 NOTE — Telephone Encounter (Signed)
#   Smears: 6 # Concentration Technique Slides (i.e. ThinPrep): 1 # Cell Block: Cell block attempted, not obtained. Additional Studies: Afirma collected.  It is unfortunate. See above. This would mean that Afirma was collected, and that was what I informed the patient. We need to redo FNA and hopefully Afirma too. Patient should be contacted.

## 2018-10-26 NOTE — Addendum Note (Signed)
Addended by: Lenore Cordia on: 10/26/2018 03:10 PM   Modules accepted: Orders

## 2018-10-26 NOTE — Telephone Encounter (Signed)
Lft msg for pt to call back. Order placed. Will schedule once pt is notified that she needs to redo.   Pt called back. Order will be sent to schedulers and they will call her

## 2018-11-29 ENCOUNTER — Telehealth: Payer: Self-pay | Admitting: "Endocrinology

## 2018-11-29 DIAGNOSIS — E061 Subacute thyroiditis: Secondary | ICD-10-CM | POA: Diagnosis not present

## 2018-11-29 NOTE — Telephone Encounter (Signed)
Tried to reach pt, no answer and VM full.

## 2018-11-29 NOTE — Telephone Encounter (Signed)
Pt said she has cut back her propranolol to once a day, she said that her heart is fine and has not had anymore of those symptoms that she was having. She said can she stop it all together?

## 2018-11-29 NOTE — Telephone Encounter (Signed)
Yes , she can stop and call us if she starts to have rapid heart rate again.

## 2018-11-30 LAB — TSH: TSH: 3.48 m[IU]/L

## 2018-11-30 LAB — T4, FREE: Free T4: 0.8 ng/dL (ref 0.8–1.8)

## 2018-12-04 NOTE — Telephone Encounter (Signed)
Pt would like to know if you could review her labs and let me know if she needs to move up her appt.

## 2018-12-04 NOTE — Telephone Encounter (Signed)
Left a VM with Dr Liliane Channel recommendation

## 2018-12-04 NOTE — Telephone Encounter (Signed)
Labs are good. No need to move up, will see her with her biopsy results in December .

## 2018-12-06 ENCOUNTER — Ambulatory Visit: Payer: BC Managed Care – PPO | Admitting: "Endocrinology

## 2018-12-07 ENCOUNTER — Other Ambulatory Visit: Payer: Self-pay | Admitting: "Endocrinology

## 2018-12-07 ENCOUNTER — Encounter (HOSPITAL_COMMUNITY): Payer: Self-pay

## 2018-12-07 ENCOUNTER — Ambulatory Visit (HOSPITAL_COMMUNITY)
Admission: RE | Admit: 2018-12-07 | Discharge: 2018-12-07 | Disposition: A | Discharge status: Home / Self Care | Payer: BC Managed Care – PPO | Source: Ambulatory Visit | Attending: "Endocrinology | Admitting: "Endocrinology

## 2018-12-07 ENCOUNTER — Other Ambulatory Visit: Payer: Self-pay

## 2018-12-07 DIAGNOSIS — E042 Nontoxic multinodular goiter: Secondary | ICD-10-CM | POA: Diagnosis not present

## 2018-12-07 DIAGNOSIS — E041 Nontoxic single thyroid nodule: Secondary | ICD-10-CM | POA: Diagnosis not present

## 2018-12-07 MED ORDER — LIDOCAINE HCL (PF) 2 % IJ SOLN
INTRAMUSCULAR | Status: AC
  Filled 2018-12-07: qty 10

## 2018-12-14 DIAGNOSIS — Z03818 Encounter for observation for suspected exposure to other biological agents ruled out: Secondary | ICD-10-CM | POA: Diagnosis not present

## 2018-12-19 DIAGNOSIS — Z03818 Encounter for observation for suspected exposure to other biological agents ruled out: Secondary | ICD-10-CM | POA: Diagnosis not present

## 2018-12-21 DIAGNOSIS — Z03818 Encounter for observation for suspected exposure to other biological agents ruled out: Secondary | ICD-10-CM | POA: Diagnosis not present

## 2018-12-25 ENCOUNTER — Telehealth: Payer: Self-pay | Admitting: "Endocrinology

## 2018-12-25 NOTE — Telephone Encounter (Signed)
Pt canceled appt for 12/23 and said since she didn't have the surgery and labs were fine, she will follow up PRN

## 2018-12-25 NOTE — Telephone Encounter (Signed)
Noted  

## 2018-12-26 DIAGNOSIS — Z03818 Encounter for observation for suspected exposure to other biological agents ruled out: Secondary | ICD-10-CM | POA: Diagnosis not present

## 2018-12-27 ENCOUNTER — Ambulatory Visit: Payer: BC Managed Care – PPO | Admitting: "Endocrinology

## 2019-01-02 DIAGNOSIS — Z20828 Contact with and (suspected) exposure to other viral communicable diseases: Secondary | ICD-10-CM | POA: Diagnosis not present

## 2019-01-04 DIAGNOSIS — Z20828 Contact with and (suspected) exposure to other viral communicable diseases: Secondary | ICD-10-CM | POA: Diagnosis not present

## 2019-02-14 DIAGNOSIS — Z20828 Contact with and (suspected) exposure to other viral communicable diseases: Secondary | ICD-10-CM | POA: Diagnosis not present

## 2019-02-21 DIAGNOSIS — Z20828 Contact with and (suspected) exposure to other viral communicable diseases: Secondary | ICD-10-CM | POA: Diagnosis not present

## 2019-02-28 DIAGNOSIS — Z20828 Contact with and (suspected) exposure to other viral communicable diseases: Secondary | ICD-10-CM | POA: Diagnosis not present

## 2019-08-14 DIAGNOSIS — Z6821 Body mass index (BMI) 21.0-21.9, adult: Secondary | ICD-10-CM | POA: Diagnosis not present

## 2019-08-14 DIAGNOSIS — Z01419 Encounter for gynecological examination (general) (routine) without abnormal findings: Secondary | ICD-10-CM | POA: Diagnosis not present

## 2019-08-14 DIAGNOSIS — Z0000 Encounter for general adult medical examination without abnormal findings: Secondary | ICD-10-CM | POA: Diagnosis not present

## 2019-08-14 DIAGNOSIS — Z130 Encounter for screening for diseases of the blood and blood-forming organs and certain disorders involving the immune mechanism: Secondary | ICD-10-CM | POA: Diagnosis not present

## 2019-08-14 DIAGNOSIS — Z1329 Encounter for screening for other suspected endocrine disorder: Secondary | ICD-10-CM | POA: Diagnosis not present

## 2019-08-14 DIAGNOSIS — Z13220 Encounter for screening for lipoid disorders: Secondary | ICD-10-CM | POA: Diagnosis not present

## 2019-08-14 DIAGNOSIS — Z1231 Encounter for screening mammogram for malignant neoplasm of breast: Secondary | ICD-10-CM | POA: Diagnosis not present

## 2020-06-01 DIAGNOSIS — Z20822 Contact with and (suspected) exposure to covid-19: Secondary | ICD-10-CM | POA: Diagnosis not present

## 2020-06-01 DIAGNOSIS — Z03818 Encounter for observation for suspected exposure to other biological agents ruled out: Secondary | ICD-10-CM | POA: Diagnosis not present

## 2020-06-01 DIAGNOSIS — Z1152 Encounter for screening for COVID-19: Secondary | ICD-10-CM | POA: Diagnosis not present

## 2020-06-01 DIAGNOSIS — R059 Cough, unspecified: Secondary | ICD-10-CM | POA: Diagnosis not present

## 2020-08-12 DIAGNOSIS — Z6821 Body mass index (BMI) 21.0-21.9, adult: Secondary | ICD-10-CM | POA: Diagnosis not present

## 2020-08-12 DIAGNOSIS — R946 Abnormal results of thyroid function studies: Secondary | ICD-10-CM | POA: Diagnosis not present

## 2020-08-12 DIAGNOSIS — Z1331 Encounter for screening for depression: Secondary | ICD-10-CM | POA: Diagnosis not present

## 2020-08-12 DIAGNOSIS — M545 Low back pain, unspecified: Secondary | ICD-10-CM | POA: Diagnosis not present

## 2020-08-12 DIAGNOSIS — Z1389 Encounter for screening for other disorder: Secondary | ICD-10-CM | POA: Diagnosis not present

## 2020-08-12 DIAGNOSIS — R000 Tachycardia, unspecified: Secondary | ICD-10-CM | POA: Diagnosis not present

## 2021-01-16 DIAGNOSIS — J042 Acute laryngotracheitis: Secondary | ICD-10-CM | POA: Diagnosis not present

## 2021-01-16 DIAGNOSIS — J329 Chronic sinusitis, unspecified: Secondary | ICD-10-CM | POA: Diagnosis not present

## 2021-02-05 DIAGNOSIS — Z124 Encounter for screening for malignant neoplasm of cervix: Secondary | ICD-10-CM | POA: Diagnosis not present

## 2021-02-05 DIAGNOSIS — Z01411 Encounter for gynecological examination (general) (routine) with abnormal findings: Secondary | ICD-10-CM | POA: Diagnosis not present

## 2021-02-05 DIAGNOSIS — Z0000 Encounter for general adult medical examination without abnormal findings: Secondary | ICD-10-CM | POA: Diagnosis not present

## 2021-02-05 DIAGNOSIS — Z1231 Encounter for screening mammogram for malignant neoplasm of breast: Secondary | ICD-10-CM | POA: Diagnosis not present

## 2021-02-05 DIAGNOSIS — Z1329 Encounter for screening for other suspected endocrine disorder: Secondary | ICD-10-CM | POA: Diagnosis not present

## 2021-02-05 DIAGNOSIS — Z13220 Encounter for screening for lipoid disorders: Secondary | ICD-10-CM | POA: Diagnosis not present

## 2021-02-05 DIAGNOSIS — Z01419 Encounter for gynecological examination (general) (routine) without abnormal findings: Secondary | ICD-10-CM | POA: Diagnosis not present

## 2021-02-05 DIAGNOSIS — Z6821 Body mass index (BMI) 21.0-21.9, adult: Secondary | ICD-10-CM | POA: Diagnosis not present

## 2021-02-05 DIAGNOSIS — Z131 Encounter for screening for diabetes mellitus: Secondary | ICD-10-CM | POA: Diagnosis not present

## 2021-02-19 IMAGING — US SOFT TISSUE ULTRASOUND HEAD/NECK
1 series · 13 of 25 positions shown · non-contrast
Comparison: None.

CLINICAL DATA: Abnormal thyroid function labs, left neck pain

EXAM:
THYROID ULTRASOUND
TECHNIQUE: Ultrasound examination of the thyroid gland and adjacent soft
tissues was performed.

[Series 1: soft tissue ultrasound head/neck · 83 acquisitions, 13 frames shown]
[im 1/83]
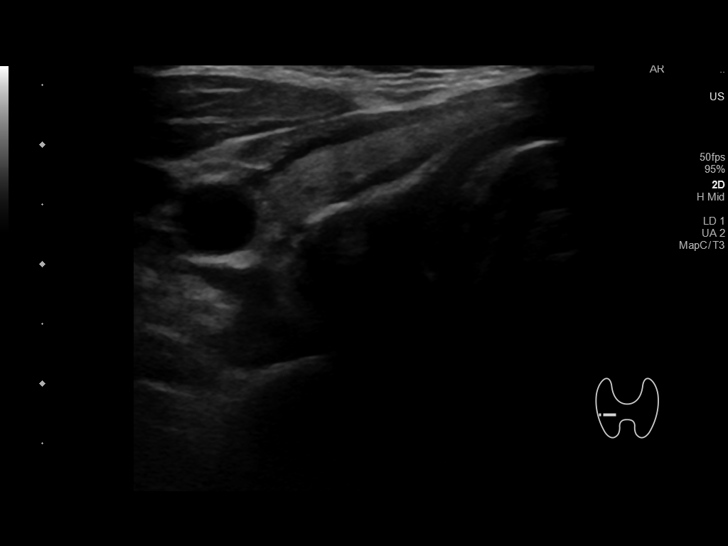
[im 7/83]
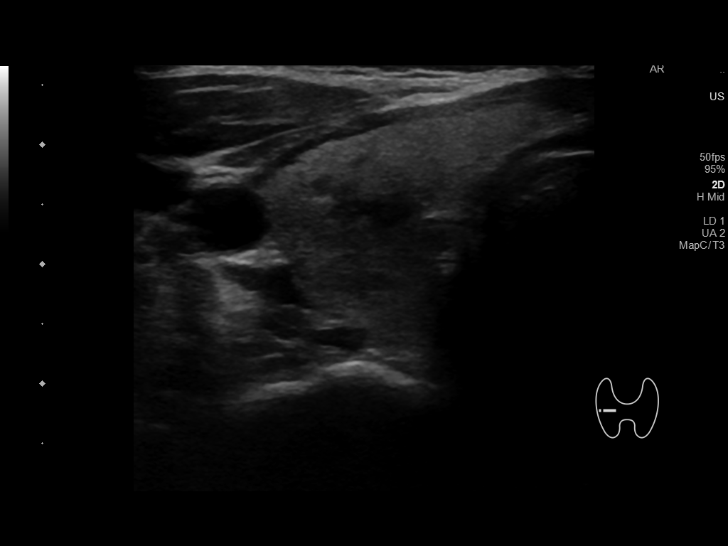
[im 14/83]
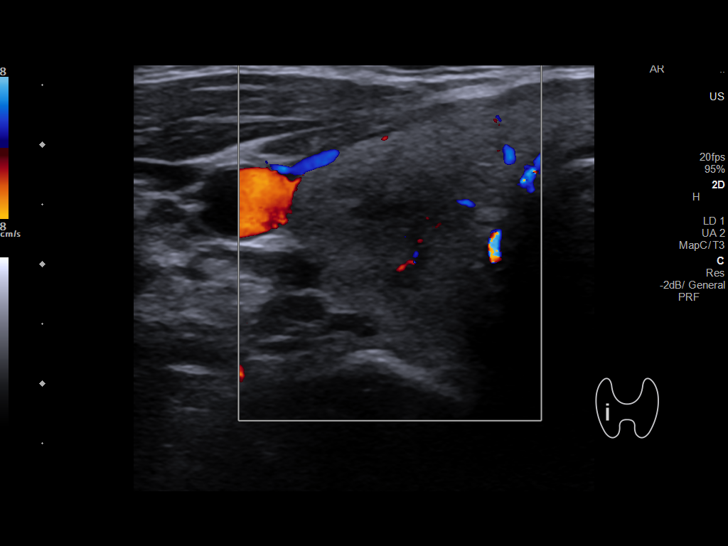
[im 21/83]
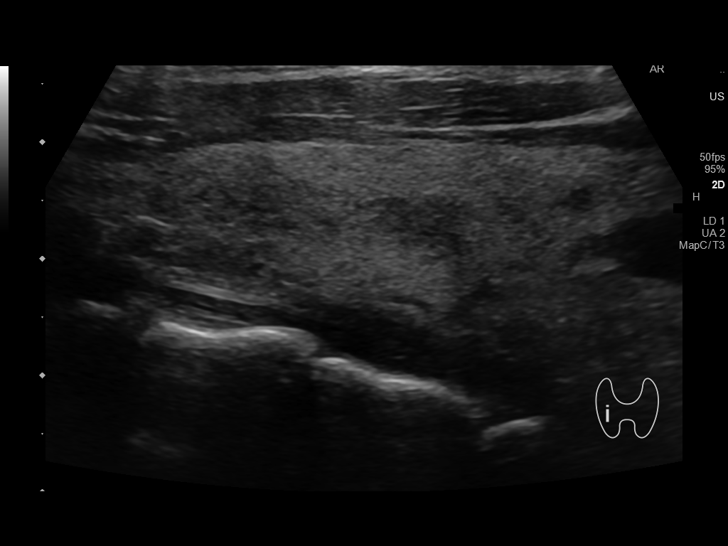
[im 28/83]
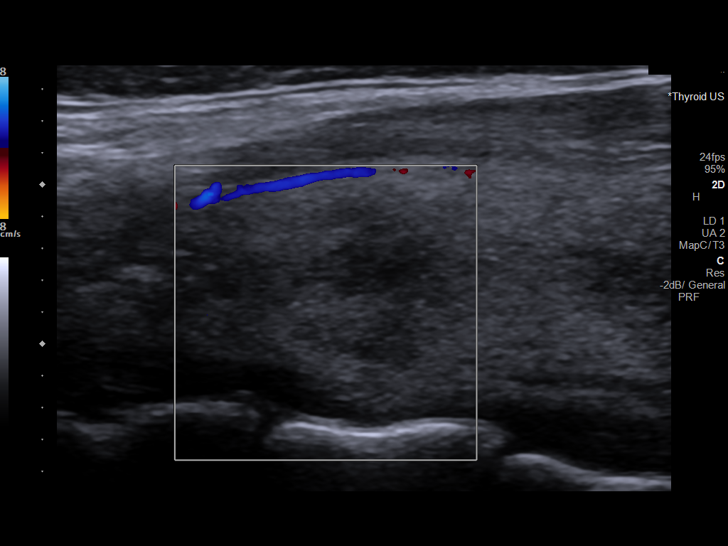
[im 35/83]
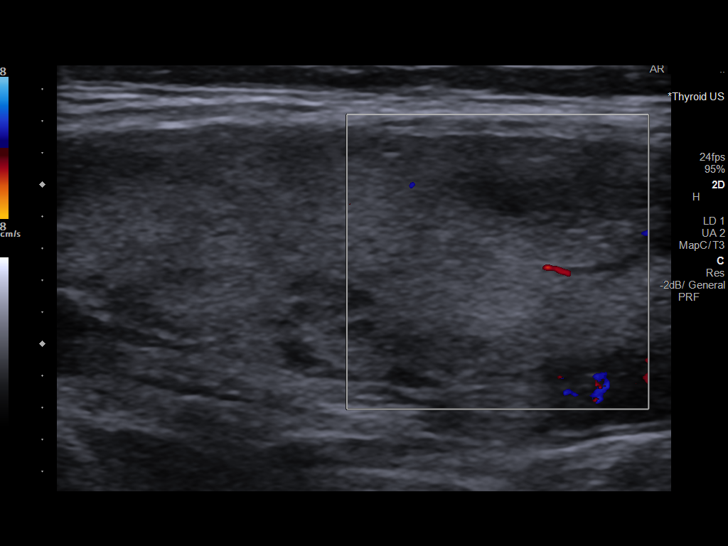
[im 42/83]
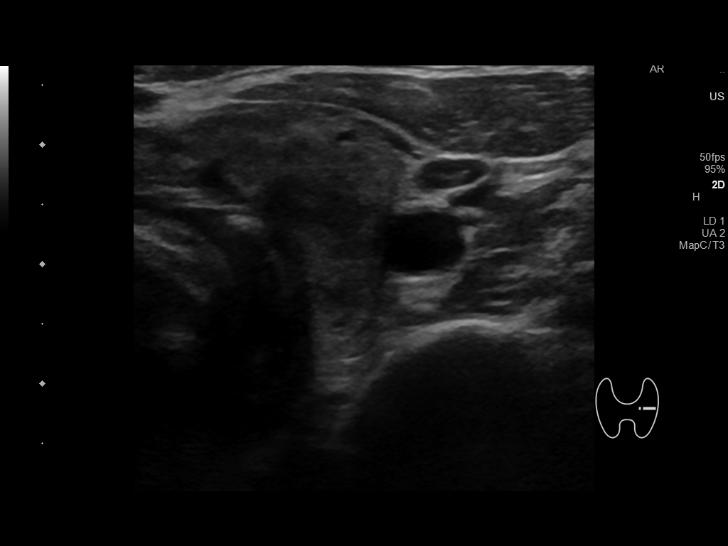
[im 48/83]
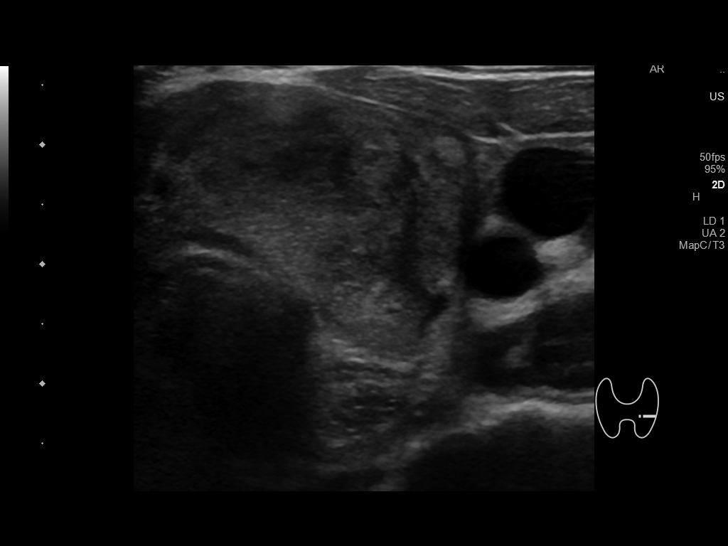
[im 55/83]
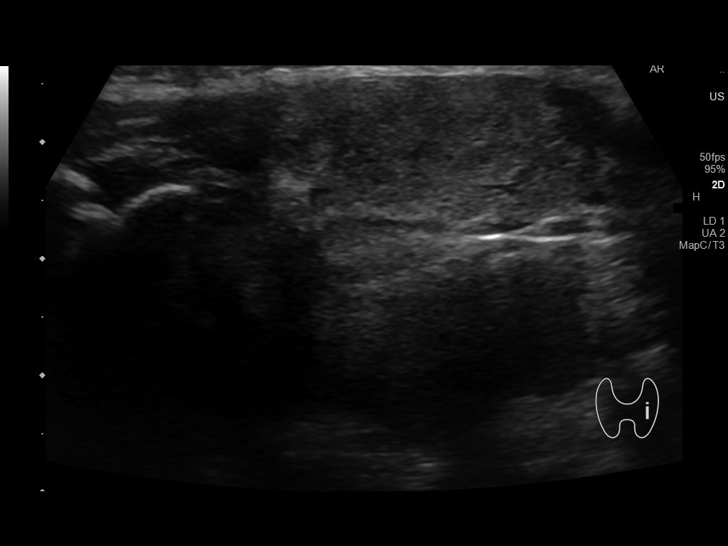
[im 62/83]
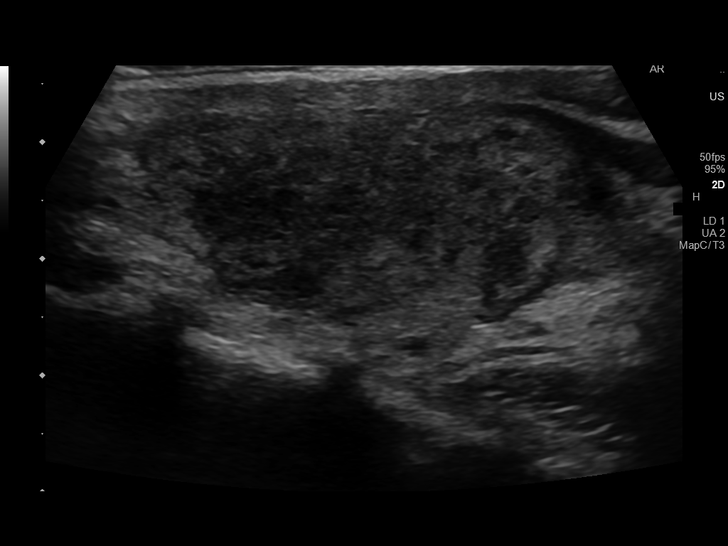
[im 69/83]
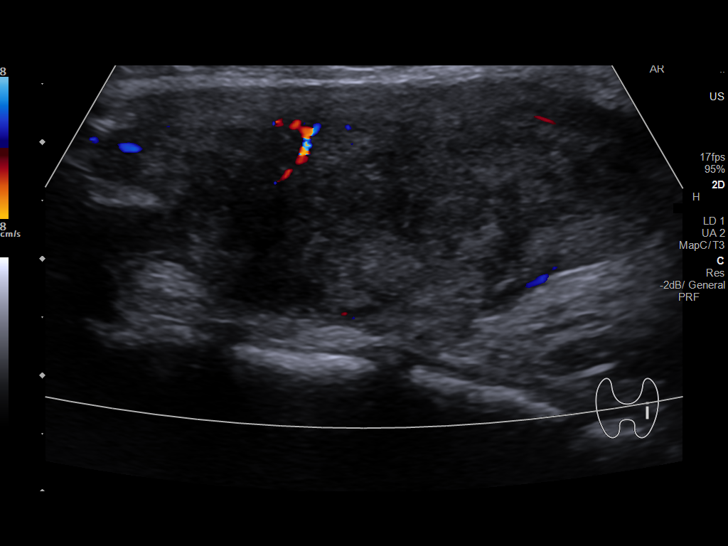
[im 76/83]
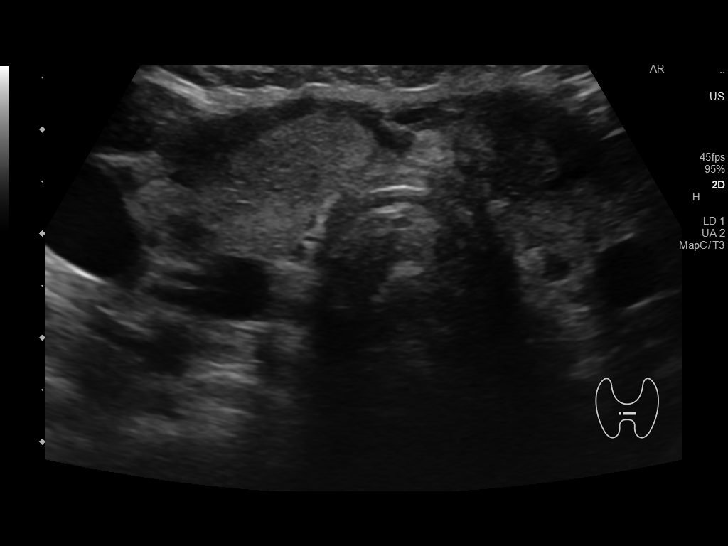
[im 83/83]
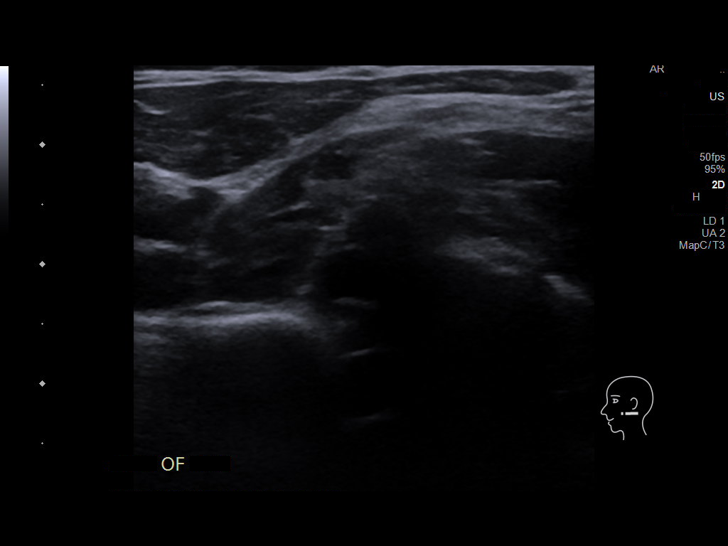

[13 of 25 positions shown; findings below may reference images not displayed]

FINDINGS: Parenchymal Echotexture: Moderately heterogenous

Isthmus: 0.4 cm thickness

Right lobe: 5.1 x 1.9 x 1.8 cm

Left lobe: 4.7 x 2.5 x 2 cm

_________________________________________________________

Estimated total number of nodules >/= 1 cm: 3

Number of spongiform nodules >/=  2 cm not described below (TR1): 0

Number of mixed cystic and solid nodules >/= 1.5 cm not described
below (TR2): 0

_________________________________________________________

Nodule # 1:

Location: Right; Superior

Maximum size: 1.3 cm; Other 2 dimensions: 1.3 x 1.3 cm

Composition: solid/almost completely solid (2)

Echogenicity: hypoechoic (2)

Shape: not taller-than-wide (0)

Margins: ill-defined (0)

Echogenic foci: none (0)

ACR TI-RADS total points: 4.

ACR TI-RADS risk category: TR4 (4-6 points).

ACR TI-RADS recommendations:

*Given size (>/= 1 - 1.4 cm) and appearance, a follow-up ultrasound
in 1 year should be considered based on TI-RADS criteria.

_________________________________________________________

Nodule # 2:

Location: Right; Inferior

Maximum size: 1.1 cm; Other 2 dimensions: 0.7 x 0.6 cm

Composition: solid/almost completely solid (2)

Echogenicity: very hypoechoic (3)

Shape: not taller-than-wide (0)

Margins: ill-defined (0)

Echogenic foci: none (0)

ACR TI-RADS total points: 5.

ACR TI-RADS risk category: TR4 (4-6 points).

ACR TI-RADS recommendations:

*Given size (>/= 1 - 1.4 cm) and appearance, a follow-up ultrasound
in 1 year should be considered based on TI-RADS criteria.

_________________________________________________________

Nodule # 3:

Location: Left; Mid

Maximum size: 3.9 cm; Other 2 dimensions: 1.9 x 2 cm

Composition: solid/almost completely solid (2)

Echogenicity: hypoechoic (2)

Shape: not taller-than-wide (0)

Margins: smooth (0)

Echogenic foci: none (0)

ACR TI-RADS total points: 4.

ACR TI-RADS risk category: TR4 (4-6 points).

ACR TI-RADS recommendations:

**Given size (>/= 1.5 cm) and appearance, fine needle aspiration of
this moderately suspicious nodule should be considered based on
TI-RADS criteria.
IMPRESSION: 1. Mild thyromegaly with bilateral nodules.
2. Recommend FNA biopsy of moderately suspicious 3.9 cm mid left
nodule.
3. Recommend annual/biennial ultrasound follow-up of right nodules
as above, until stability x5 years confirmed.

The above is in keeping with the ACR TI-RADS recommendations - [HOSPITAL] 0033;[DATE].

## 2021-03-11 IMAGING — US ULTRASOUND FNA BIOPSY THYROID 1ST LESION
1 series · 13 of 13 positions shown · non-contrast
Comparison: US Soft Tissue Head/Neck 08/11/18

NM Thyroid 08/23/18:

INDICATION: Indeterminate thyroid nodule

Left mid thyroid nodule 3.9 cm
EXAM:
ULTRASOUND GUIDED FINE NEEDLE ASPIRATION OF INDETERMINATE THYROID
NODULE
TECHNIQUE: Informed written consent was obtained from the patient after a
discussion of the risks, benefits and alternatives to treatment.
Questions regarding the procedure were encouraged and answered. A
timeout was performed prior to the initiation of the procedure.

[Series 1: ultrasound fna biopsy thyroid 1st lesion · 13 acquisitions, 13 frames shown]
[im 1/13]
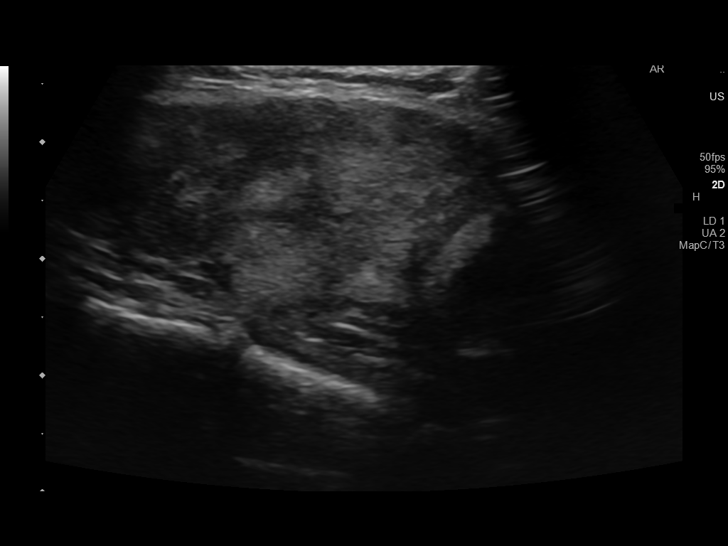
[im 2/13]
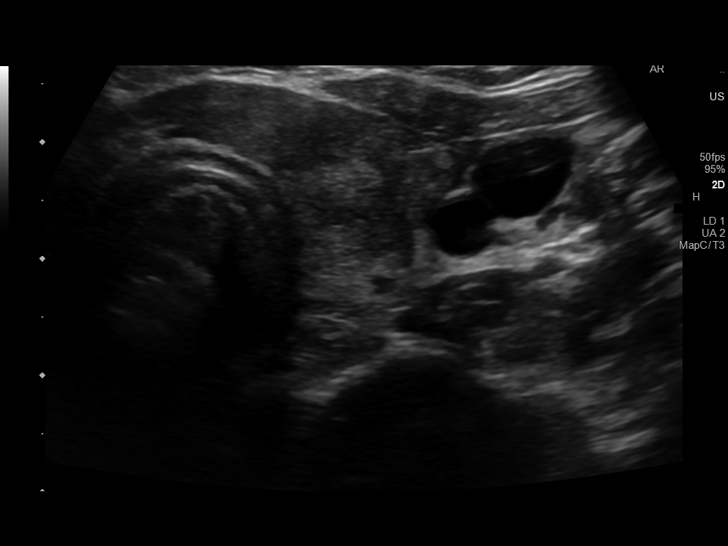
[im 3/13]
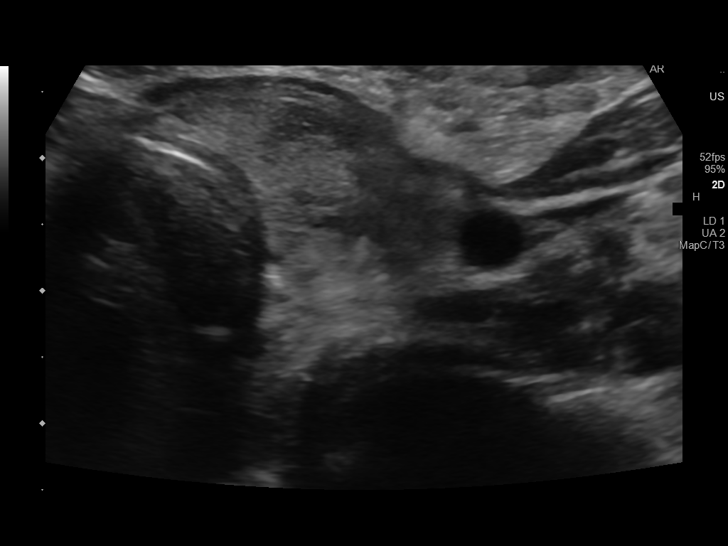
[im 4/13]
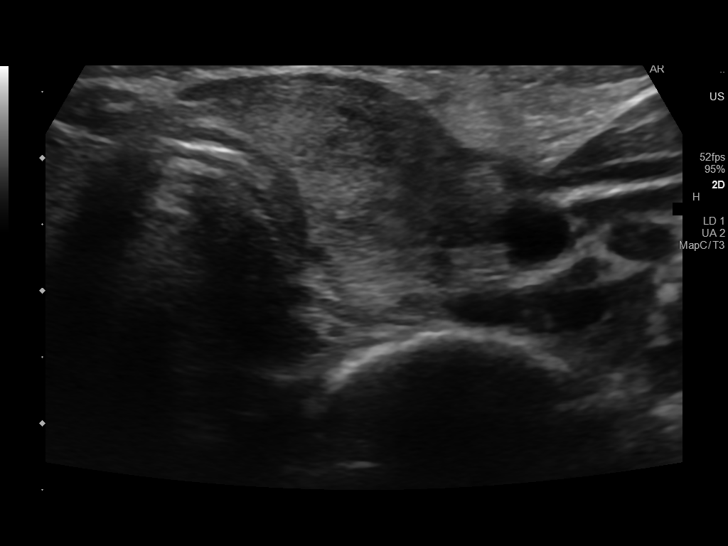
[im 5/13]
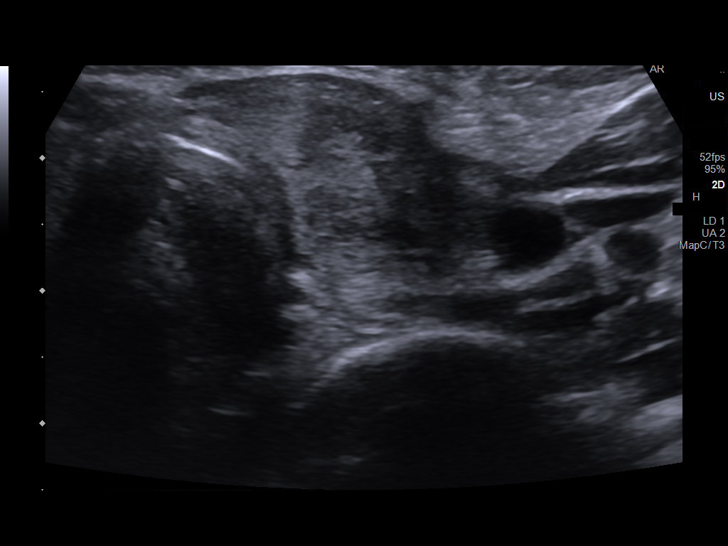
[im 6/13]
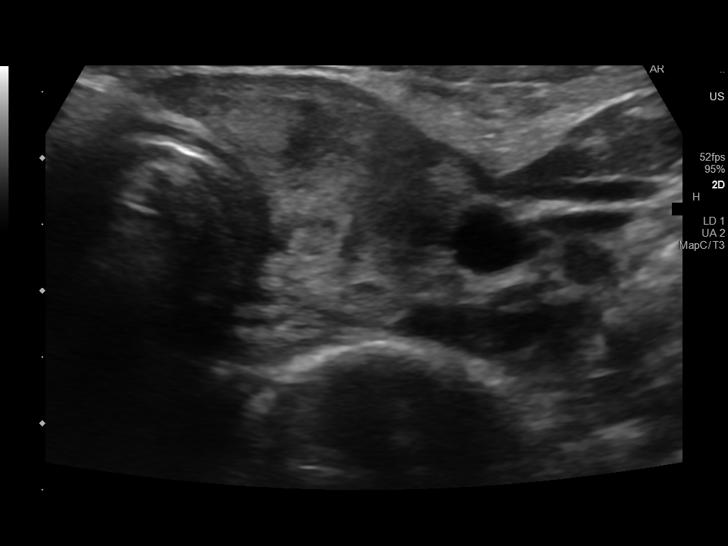
[im 7/13]
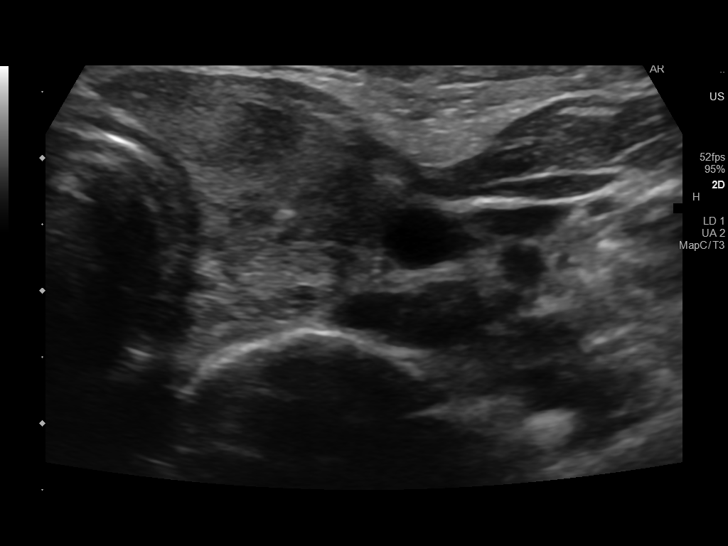
[im 8/13]
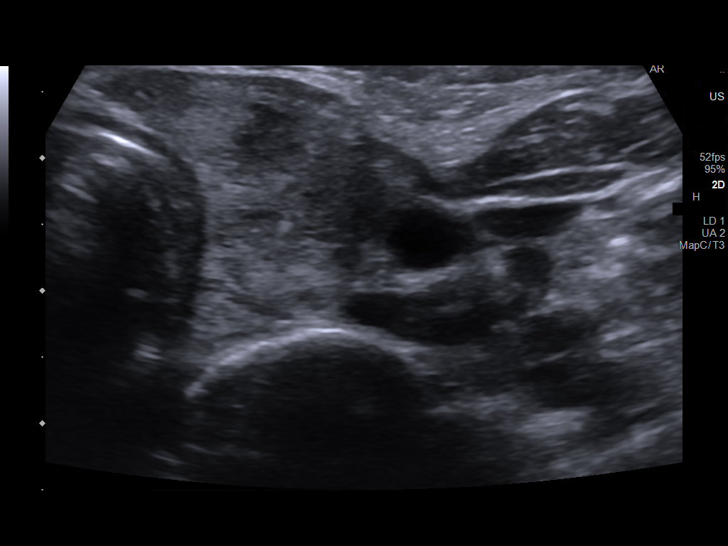
[im 9/13]
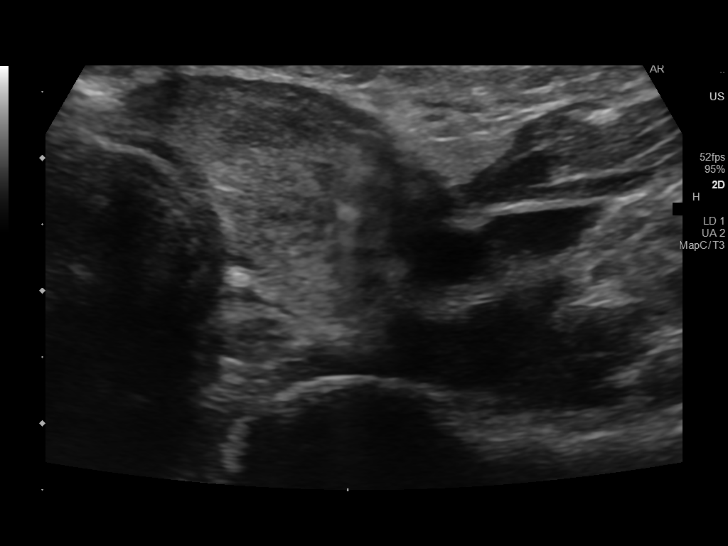
[im 10/13]
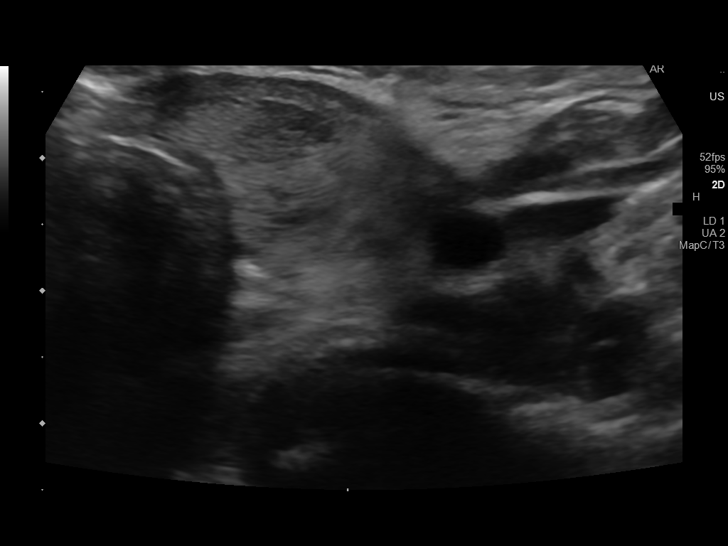
[im 11/13]
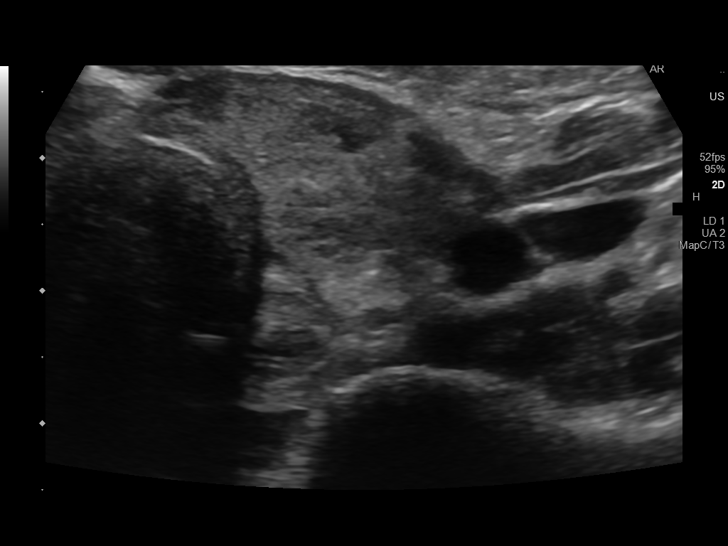
[im 12/13]
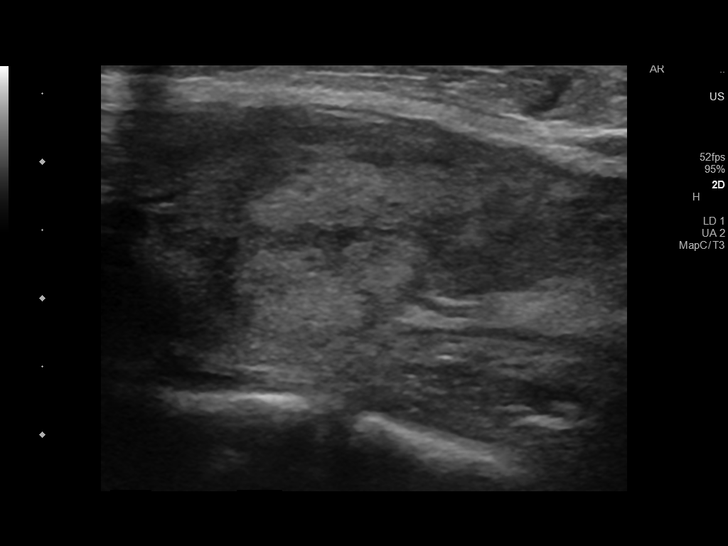
[im 13/13]
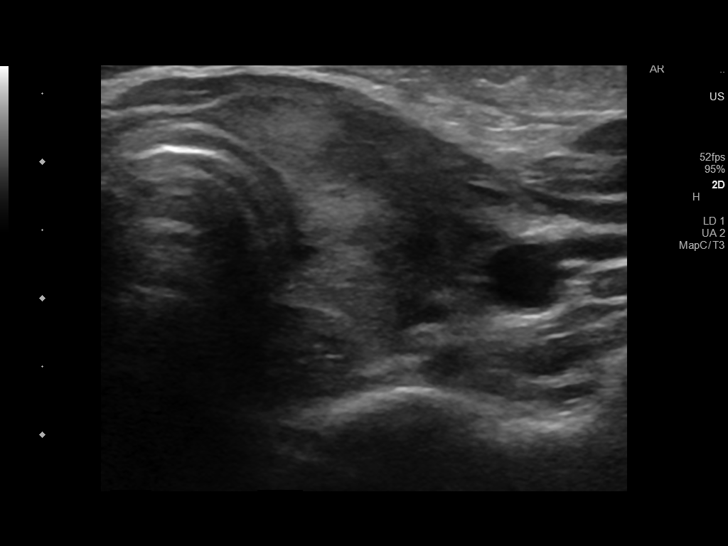

[13 of 13 positions shown; findings below may reference images not displayed]

IMPRESSION: Markedly decreased 4 hour and 24 hour radio iodine uptakes.

Insufficient tracer localization to assess thyroid morphology.

MEDICATIONS:
6 cc 1% lidocaine

COMPLICATIONS:
None immediate.
Pre-procedural ultrasound scanning demonstrated unchanged size and
appearance of the indeterminate nodule within the left thyroid

The procedure was planned. The neck was prepped in the usual sterile
fashion, and a sterile drape was applied covering the operative
field. A timeout was performed prior to the initiation of the
procedure. Local anesthesia was provided with 1% lidocaine.

Under direct ultrasound guidance, 6 FNA biopsies were performed of
the left mid lobe thyroid nodule with a 25 gauge needle.

3 samples were obtained for AFIRMA per ordering ALIO

Multiple ultrasound images were saved for procedural documentation
purposes. The samples were prepared and submitted to pathology.

Limited post procedural scanning was negative for hematoma or
additional complication. Dressings were placed. The patient
tolerated the above procedures procedure well without immediate
postprocedural complication.
FINDINGS: Nodule reference number based on prior diagnostic ultrasound: 3

Maximum size: 3.9 cm

Location: Left; Mid

ACR TI-RADS risk category: TR4 (4-6 points)

Reason for biopsy: meets ACR TI-RADS criteria

Ultrasound imaging confirms appropriate placement of the needles
within the thyroid nodule.
IMPRESSION: Technically successful ultrasound guided fine needle aspiration of
left mid lobe thyroid biopsy

Read by

Joshi Downs

## 2021-05-01 DIAGNOSIS — Z640 Problems related to unwanted pregnancy: Secondary | ICD-10-CM | POA: Diagnosis not present

## 2021-05-01 DIAGNOSIS — Z3200 Encounter for pregnancy test, result unknown: Secondary | ICD-10-CM | POA: Diagnosis not present

## 2021-05-05 DIAGNOSIS — O021 Missed abortion: Secondary | ICD-10-CM | POA: Diagnosis not present

## 2021-05-11 DIAGNOSIS — O021 Missed abortion: Secondary | ICD-10-CM | POA: Diagnosis not present

## 2021-05-14 DIAGNOSIS — O021 Missed abortion: Secondary | ICD-10-CM | POA: Diagnosis not present

## 2021-05-18 DIAGNOSIS — O039 Complete or unspecified spontaneous abortion without complication: Secondary | ICD-10-CM | POA: Diagnosis not present

## 2021-06-05 DIAGNOSIS — O021 Missed abortion: Secondary | ICD-10-CM | POA: Diagnosis not present

## 2022-06-22 DIAGNOSIS — J069 Acute upper respiratory infection, unspecified: Secondary | ICD-10-CM | POA: Diagnosis not present

## 2022-07-20 DIAGNOSIS — Z01419 Encounter for gynecological examination (general) (routine) without abnormal findings: Secondary | ICD-10-CM | POA: Diagnosis not present

## 2022-07-20 DIAGNOSIS — Z01411 Encounter for gynecological examination (general) (routine) with abnormal findings: Secondary | ICD-10-CM | POA: Diagnosis not present

## 2022-07-20 DIAGNOSIS — Z1231 Encounter for screening mammogram for malignant neoplasm of breast: Secondary | ICD-10-CM | POA: Diagnosis not present

## 2022-07-20 DIAGNOSIS — Z124 Encounter for screening for malignant neoplasm of cervix: Secondary | ICD-10-CM | POA: Diagnosis not present

## 2022-08-11 DIAGNOSIS — X32XXXA Exposure to sunlight, initial encounter: Secondary | ICD-10-CM | POA: Diagnosis not present

## 2022-08-11 DIAGNOSIS — L570 Actinic keratosis: Secondary | ICD-10-CM | POA: Diagnosis not present

## 2022-08-11 DIAGNOSIS — D485 Neoplasm of uncertain behavior of skin: Secondary | ICD-10-CM | POA: Diagnosis not present

## 2022-08-11 DIAGNOSIS — L568 Other specified acute skin changes due to ultraviolet radiation: Secondary | ICD-10-CM | POA: Diagnosis not present

## 2022-09-07 DIAGNOSIS — X32XXXA Exposure to sunlight, initial encounter: Secondary | ICD-10-CM | POA: Diagnosis not present

## 2022-09-07 DIAGNOSIS — L568 Other specified acute skin changes due to ultraviolet radiation: Secondary | ICD-10-CM | POA: Diagnosis not present

## 2022-09-07 DIAGNOSIS — L570 Actinic keratosis: Secondary | ICD-10-CM | POA: Diagnosis not present

## 2023-02-24 DIAGNOSIS — L570 Actinic keratosis: Secondary | ICD-10-CM | POA: Diagnosis not present

## 2023-08-06 ENCOUNTER — Other Ambulatory Visit: Payer: Self-pay

## 2023-08-06 ENCOUNTER — Emergency Department (HOSPITAL_COMMUNITY)
Admission: EM | Admit: 2023-08-06 | Discharge: 2023-08-06 | Disposition: A | Discharge status: Home / Self Care | Attending: Emergency Medicine | Admitting: Emergency Medicine

## 2023-08-06 ENCOUNTER — Encounter (HOSPITAL_COMMUNITY): Payer: Self-pay

## 2023-08-06 ENCOUNTER — Emergency Department (HOSPITAL_COMMUNITY)

## 2023-08-06 DIAGNOSIS — S022XXA Fracture of nasal bones, initial encounter for closed fracture: Secondary | ICD-10-CM | POA: Diagnosis not present

## 2023-08-06 DIAGNOSIS — S0121XA Laceration without foreign body of nose, initial encounter: Secondary | ICD-10-CM

## 2023-08-06 DIAGNOSIS — X58XXXA Exposure to other specified factors, initial encounter: Secondary | ICD-10-CM | POA: Diagnosis not present

## 2023-08-06 DIAGNOSIS — R040 Epistaxis: Secondary | ICD-10-CM | POA: Diagnosis not present

## 2023-08-06 DIAGNOSIS — K429 Umbilical hernia without obstruction or gangrene: Secondary | ICD-10-CM | POA: Diagnosis not present

## 2023-08-06 DIAGNOSIS — M5136 Other intervertebral disc degeneration, lumbar region with discogenic back pain only: Secondary | ICD-10-CM | POA: Diagnosis not present

## 2023-08-06 DIAGNOSIS — M545 Low back pain, unspecified: Secondary | ICD-10-CM | POA: Insufficient documentation

## 2023-08-06 DIAGNOSIS — Z23 Encounter for immunization: Secondary | ICD-10-CM | POA: Insufficient documentation

## 2023-08-06 DIAGNOSIS — R109 Unspecified abdominal pain: Secondary | ICD-10-CM | POA: Diagnosis not present

## 2023-08-06 DIAGNOSIS — R1031 Right lower quadrant pain: Secondary | ICD-10-CM | POA: Insufficient documentation

## 2023-08-06 DIAGNOSIS — R9431 Abnormal electrocardiogram [ECG] [EKG]: Secondary | ICD-10-CM | POA: Diagnosis not present

## 2023-08-06 DIAGNOSIS — S0992XA Unspecified injury of nose, initial encounter: Secondary | ICD-10-CM | POA: Diagnosis not present

## 2023-08-06 DIAGNOSIS — Z043 Encounter for examination and observation following other accident: Secondary | ICD-10-CM | POA: Diagnosis not present

## 2023-08-06 DIAGNOSIS — M47816 Spondylosis without myelopathy or radiculopathy, lumbar region: Secondary | ICD-10-CM | POA: Diagnosis not present

## 2023-08-06 DIAGNOSIS — D72829 Elevated white blood cell count, unspecified: Secondary | ICD-10-CM | POA: Insufficient documentation

## 2023-08-06 LAB — CBC
HCT: 41.5 % (ref 36.0–46.0)
Hemoglobin: 13.7 g/dL (ref 12.0–15.0)
MCH: 30.9 pg (ref 26.0–34.0)
MCHC: 33 g/dL (ref 30.0–36.0)
MCV: 93.5 fL (ref 80.0–100.0)
Platelets: 325 K/uL (ref 150–400)
RBC: 4.44 MIL/uL (ref 3.87–5.11)
RDW: 12.6 % (ref 11.5–15.5)
WBC: 11.5 K/uL — ABNORMAL HIGH (ref 4.0–10.5)
nRBC: 0 % (ref 0.0–0.2)

## 2023-08-06 LAB — COMPREHENSIVE METABOLIC PANEL WITH GFR
ALT: 23 U/L (ref 0–44)
AST: 23 U/L (ref 15–41)
Albumin: 4 g/dL (ref 3.5–5.0)
Alkaline Phosphatase: 40 U/L (ref 38–126)
Anion gap: 9 (ref 5–15)
BUN: 11 mg/dL (ref 6–20)
CO2: 22 mmol/L (ref 22–32)
Calcium: 9 mg/dL (ref 8.9–10.3)
Chloride: 106 mmol/L (ref 98–111)
Creatinine, Ser: 0.84 mg/dL (ref 0.44–1.00)
GFR, Estimated: >60 mL/min (ref >60)
Glucose, Bld: 104 mg/dL — ABNORMAL HIGH (ref 70–99)
Potassium: 4 mmol/L (ref 3.5–5.1)
Sodium: 137 mmol/L (ref 135–145)
Total Bilirubin: 0.7 mg/dL (ref 0.0–1.2)
Total Protein: 7.2 g/dL (ref 6.5–8.1)

## 2023-08-06 LAB — URINALYSIS, ROUTINE W REFLEX MICROSCOPIC
Bilirubin Urine: NEGATIVE
Glucose, UA: NEGATIVE mg/dL
Hgb urine dipstick: NEGATIVE
Ketones, ur: NEGATIVE mg/dL
Nitrite: NEGATIVE
Protein, ur: 30 mg/dL — AB
Specific Gravity, Urine: 1.028 (ref 1.005–1.030)
pH: 5 (ref 5.0–8.0)

## 2023-08-06 LAB — HCG, SERUM, QUALITATIVE: Preg, Serum: NEGATIVE

## 2023-08-06 LAB — CBG MONITORING, ED: Glucose-Capillary: 103 mg/dL — ABNORMAL HIGH (ref 70–99)

## 2023-08-06 MED ORDER — ONDANSETRON HCL 4 MG/2ML IJ SOLN
4.0000 mg | Freq: Once | INTRAMUSCULAR | Status: AC
  Administered 2023-08-06: 4 mg via INTRAVENOUS
  Filled 2023-08-06: qty 2

## 2023-08-06 MED ORDER — TRANEXAMIC ACID FOR EPISTAXIS
500.0000 mg | Freq: Once | TOPICAL | Status: AC
  Administered 2023-08-06: 500 mg via TOPICAL
  Filled 2023-08-06: qty 10

## 2023-08-06 MED ORDER — MORPHINE SULFATE (PF) 4 MG/ML IV SOLN
4.0000 mg | Freq: Once | INTRAVENOUS | Status: AC
  Administered 2023-08-06: 4 mg via INTRAVENOUS
  Filled 2023-08-06: qty 1

## 2023-08-06 MED ORDER — TETANUS-DIPHTH-ACELL PERTUSSIS 5-2.5-18.5 LF-MCG/0.5 IM SUSY
0.5000 mL | PREFILLED_SYRINGE | Freq: Once | INTRAMUSCULAR | Status: AC
  Administered 2023-08-06: 0.5 mL via INTRAMUSCULAR
  Filled 2023-08-06: qty 0.5

## 2023-08-06 MED ORDER — OXYMETAZOLINE HCL 0.05 % NA SOLN
1.0000 | Freq: Once | NASAL | Status: AC
  Administered 2023-08-06: 1 via NASAL
  Filled 2023-08-06: qty 30

## 2023-08-06 MED ORDER — KETOROLAC TROMETHAMINE 15 MG/ML IJ SOLN
15.0000 mg | Freq: Once | INTRAMUSCULAR | Status: AC
  Administered 2023-08-06: 15 mg via INTRAVENOUS
  Filled 2023-08-06: qty 1

## 2023-08-06 MED ORDER — METHOCARBAMOL 500 MG PO TABS: 500.0000 mg | ORAL_TABLET | Freq: Two times a day (BID) | ORAL | 0 refills | Status: DC

## 2023-08-06 MED ORDER — HYDROCODONE-ACETAMINOPHEN 5-325 MG PO TABS: 1.0000 | ORAL_TABLET | Freq: Four times a day (QID) | ORAL | 0 refills | Status: DC | PRN

## 2023-08-06 MED ORDER — METHOCARBAMOL 500 MG PO TABS
500.0000 mg | ORAL_TABLET | Freq: Once | ORAL | Status: AC
  Administered 2023-08-06: 500 mg via ORAL
  Filled 2023-08-06: qty 1

## 2023-08-06 MED ORDER — HYDROCODONE-ACETAMINOPHEN 5-325 MG PO TABS: 1.0000 | ORAL_TABLET | Freq: Once | ORAL | Status: DC

## 2023-08-06 MED ORDER — SODIUM CHLORIDE 0.9 % IV BOLUS
1000.0000 mL | Freq: Once | INTRAVENOUS | Status: AC
  Administered 2023-08-06: 1000 mL via INTRAVENOUS

## 2023-08-06 NOTE — ED Provider Notes (Signed)
 [place] EMERGENCY DEPARTMENT AT [facility] Provider Note   CSN: [id number] Arrival date & time: 08/06/23  9081     Patient presents with: Loss of Consciousness and Epistaxis   Marilyn Bryant is a 43 y.o. female.  Patient with noncontributory past medical history presents to emergency room after syncopal event.  Patient reports that she went to use the bathroom started feeling very hot and flushed as well as lightheaded and then passed out.  She did hit her head and face as she fell.  Family member reports that she had a loss of consciousness for about 5 seconds and then came back to.  Has then been acting appropriately.  She notes nose pain and epistaxis that has been ongoing since approximately 7 AM.  She is not on blood thinners. Does note that prior to passing out she was experiencing left flank pain and she is wondering if possibly she has kidney stone.  Denies any chest pain shortness of breath cough nausea vomiting or diarrhea.    Loss of Consciousness Epistaxis Associated symptoms: syncope        Prior to Admission medications   Medication Sig Start Date End Date Taking? Authorizing Provider  cetirizine (ZYRTEC) 10 MG tablet Take 10 mg by mouth daily.    [provider]  norethindrone-ethinyl estradiol (LOESTRIN FE) 1-20 MG-MCG tablet Take 1 tablet by mouth daily.    [provider]  predniSONE  (DELTASONE ) 20 MG tablet Take 1 tablet (20 mg total) by mouth daily with breakfast. 09/18/18   Nida, Gebreselassie W, MD  propranolol  (INDERAL ) 40 MG tablet Take 1 tablet (40 mg total) by mouth 3 (three) times daily. 09/04/18   Nida, Gebreselassie W, MD    Allergies: Patient has no known allergies.    Review of Systems  HENT:  Positive for nosebleeds.   Cardiovascular:  Positive for syncope.    Updated Vital Signs BP 118/68 (BP Location: Right Arm)   Pulse 74   Temp 97.6 F (36.4 C) (Oral)   Resp 18   LMP 06/07/2023 (Approximate)   SpO2 100%    Physical Exam Vitals and nursing note reviewed.  Constitutional:      General: She is not in acute distress.    Appearance: She is not toxic-appearing.  HENT:     Head: Normocephalic and atraumatic.     Comments: Bruising to anterior forehead.    Mouth/Throat:      Comments: Bleeding out of the left nare. Eyes:     General: No scleral icterus.    Conjunctiva/sclera: Conjunctivae normal.  Cardiovascular:     Rate and Rhythm: Normal rate and regular rhythm.     Pulses: Normal pulses.     Heart sounds: Normal heart sounds.  Pulmonary:     Effort: Pulmonary effort is normal. No respiratory distress.     Breath sounds: Normal breath sounds.  Abdominal:     General: Abdomen is flat. Bowel sounds are normal.     Palpations: Abdomen is soft.     Tenderness: There is no abdominal tenderness.  Musculoskeletal:     Right lower leg: No edema.     Left lower leg: No edema.  Skin:    General: Skin is warm and dry.     Findings: No lesion.     Comments: Small 0.5 cm laceration to bridge of nose.  Neurological:     General: No focal deficit present.     Mental Status: She is alert and oriented  to person, place, and time. Mental status is at baseline.     (all labs ordered are listed, but only abnormal results are displayed) Labs Reviewed  COMPREHENSIVE METABOLIC PANEL WITH GFR - Abnormal; Notable for the following components:      Result Value   Glucose, Bld 104 (*)    All other components within normal limits  CBC - Abnormal; Notable for the following components:   WBC 11.5 (*)    All other components within normal limits  URINALYSIS, ROUTINE W REFLEX MICROSCOPIC - Abnormal; Notable for the following components:   Color, Urine AMBER (*)    APPearance HAZY (*)    Protein, ur 30 (*)    Leukocytes,Ua SMALL (*)    Bacteria, UA FEW (*)    All other components within normal limits  CBG MONITORING, ED - Abnormal; Notable for the following components:   Glucose-Capillary 103 (*)     All other components within normal limits  HCG, SERUM, QUALITATIVE    EKG: EKG Interpretation Date/Time:  Saturday August 06 2023 09:23:42 EDT Ventricular Rate:  74 PR Interval:  148 QRS Duration:  76 QT Interval:  388 QTC Calculation: 430 R Axis:   69  Text Interpretation: Normal sinus rhythm Confirmed by Melvenia Motto ([id number]) on 08/06/2023 12:16:30 PM  Radiology: CT L-SPINE NO CHARGE Result Date: 08/06/2023 CLINICAL DATA:  Right low back pain and fall. EXAM: CT LUMBAR SPINE WITHOUT CONTRAST TECHNIQUE: Multidetector CT imaging of the lumbar spine was performed without intravenous contrast administration. Multiplanar CT image reconstructions were also generated. RADIATION DOSE REDUCTION: This exam was performed according to the departmental dose-optimization program which includes automated exposure control, adjustment of the mA and/or kV according to patient size and/or use of iterative reconstruction technique. COMPARISON:  None Available. FINDINGS: Segmentation: 5 lumbar type vertebrae. Alignment: Normal. Vertebrae: Vertebral body heights are normal. Very minimal early spondylosis of the lumbar spine. No compression fracture. Paraspinal and other soft tissues: Normal. Disc levels: L4-5 level demonstrates a proper is disc bulge without focal disc herniation. Remaining disc spaces are unremarkable. IMPRESSION: 1. No acute findings. 2. Very minimal early spondylosis of the lumbar spine. 3. Mild disc bulge at the L4-5 level without focal disc herniation. Electronically Signed   By: Toribio Agreste M.D.   On: 08/06/2023 13:16   CT Renal Stone Study Result Date: 08/06/2023 CLINICAL DATA:  Right flank pain and right lower abdominal pain prior to fall. EXAM: CT ABDOMEN AND PELVIS WITHOUT CONTRAST TECHNIQUE: Multidetector CT imaging of the abdomen and pelvis was performed following the standard protocol without IV contrast. RADIATION DOSE REDUCTION: This exam was performed according to the departmental  dose-optimization program which includes automated exposure control, adjustment of the mA and/or kV according to patient size and/or use of iterative reconstruction technique. COMPARISON:  None Available. FINDINGS: Lower chest: Heart is normal size.  Visualized lung bases are clear. Hepatobiliary: Liver, gallbladder and biliary tree are normal. Pancreas: Normal. Spleen: Normal. Adrenals/Urinary Tract: Adrenal glands are normal. Kidneys are normal in size without hydronephrosis or nephrolithiasis. Ureters and bladder are normal. Stomach/Bowel: Stomach and small bowel are normal. Appendix is normal. Colon is normal. Vascular/Lymphatic: Abdominal aorta is normal in caliber. No evidence of adenopathy. Reproductive: Normal. Other: Small umbilical hernia containing only peritoneal fat. No free fluid or focal inflammatory change. Musculoskeletal: No acute findings. IMPRESSION: 1. No acute findings in the abdomen/pelvis. 2. Small umbilical hernia containing only peritoneal fat. Electronically Signed   By: Toribio Agreste M.D.   On:  08/06/2023 13:13   CT HEAD WO CONTRAST Result Date: 08/06/2023 CLINICAL DATA:  Possible syncope with resulting fall from standing position. Facial trauma. EXAM: CT HEAD WITHOUT CONTRAST CT MAXILLOFACIAL WITHOUT CONTRAST TECHNIQUE: Multidetector CT imaging of the head and maxillofacial structures were performed using the standard protocol without intravenous contrast. Multiplanar CT image reconstructions of the maxillofacial structures were also generated. RADIATION DOSE REDUCTION: This exam was performed according to the departmental dose-optimization program which includes automated exposure control, adjustment of the mA and/or kV according to patient size and/or use of iterative reconstruction technique. COMPARISON:  None Available. FINDINGS: CT HEAD FINDINGS Brain: No evidence of acute infarction, hemorrhage, hydrocephalus, extra-axial collection or mass lesion/mass effect. Vascular: No  hyperdense vessel or unexpected calcification. Skull: Normal. Negative for fracture or focal lesion. Other: None. CT MAXILLOFACIAL FINDINGS Osseous: Examination demonstrates a minimally displaced nasal bone fracture with associated soft tissue swelling. No additional facial bone fractures visualized. Orbits: Negative. No traumatic or inflammatory finding. Sinuses: Hypoplastic frontal sinuses. Remaining sinuses are well aerated and well developed. There is minimal opacification over the anterior left ethmoid sinus and frontal ethmoidal recess. Soft tissues: Minimal soft tissue swelling over the nasal bone fracture as described above. IMPRESSION: 1. No acute intracranial findings. 2. Minimally displaced nasal bone fracture with associated soft tissue swelling. Electronically Signed   By: Toribio Agreste M.D.   On: 08/06/2023 10:37   CT Maxillofacial Wo Contrast Result Date: 08/06/2023 CLINICAL DATA:  Possible syncope with resulting fall from standing position. Facial trauma. EXAM: CT HEAD WITHOUT CONTRAST CT MAXILLOFACIAL WITHOUT CONTRAST TECHNIQUE: Multidetector CT imaging of the head and maxillofacial structures were performed using the standard protocol without intravenous contrast. Multiplanar CT image reconstructions of the maxillofacial structures were also generated. RADIATION DOSE REDUCTION: This exam was performed according to the departmental dose-optimization program which includes automated exposure control, adjustment of the mA and/or kV according to patient size and/or use of iterative reconstruction technique. COMPARISON:  None Available. FINDINGS: CT HEAD FINDINGS Brain: No evidence of acute infarction, hemorrhage, hydrocephalus, extra-axial collection or mass lesion/mass effect. Vascular: No hyperdense vessel or unexpected calcification. Skull: Normal. Negative for fracture or focal lesion. Other: None. CT MAXILLOFACIAL FINDINGS Osseous: Examination demonstrates a minimally displaced nasal bone  fracture with associated soft tissue swelling. No additional facial bone fractures visualized. Orbits: Negative. No traumatic or inflammatory finding. Sinuses: Hypoplastic frontal sinuses. Remaining sinuses are well aerated and well developed. There is minimal opacification over the anterior left ethmoid sinus and frontal ethmoidal recess. Soft tissues: Minimal soft tissue swelling over the nasal bone fracture as described above. IMPRESSION: 1. No acute intracranial findings. 2. Minimally displaced nasal bone fracture with associated soft tissue swelling. Electronically Signed   By: Toribio Agreste M.D.   On: 08/06/2023 10:37     .Laceration Repair  Date/Time: 08/06/2023 2:08 PM  Performed by: Shermon Warren SAILOR, PA-C Authorized by: Shermon Warren SAILOR, PA-C   Consent:    Consent obtained:  Verbal   Consent given by:  Patient   Risks, benefits, and alternatives were discussed: yes     Risks discussed:  Infection, need for additional repair, nerve damage, poor wound healing, poor cosmetic result, pain, vascular damage, tendon damage and retained foreign body   Alternatives discussed:  Referral, delayed treatment, no treatment and observation Universal protocol:    Procedure explained and questions answered to patient or proxy's satisfaction: yes     Relevant documents present and verified: yes     Test results available: yes  Imaging studies available: yes     Required blood products, implants, devices, and special equipment available: yes     Site/side marked: yes     Immediately prior to procedure, a time out was called: yes     Patient identity confirmed:  Verbally with patient Anesthesia:    Anesthesia method:  None Laceration details:    Location:  Face   Face location:  Nose   Length (cm):  0.5 Skin repair:    Repair method:  Tissue adhesive Approximation:    Approximation:  Close Repair type:    Repair type:  Simple Post-procedure details:    Dressing:  Open (no dressing)    Procedure completion:  Tolerated    Medications Ordered in the ED  oxymetazoline  (AFRIN) 0.05 % nasal spray 1 spray (1 spray Each Nare Given 08/06/23 0947)  sodium chloride  0.9 % bolus 1,000 mL (0 mLs Intravenous Stopped 08/06/23 1207)  tranexamic acid  (CYKLOKAPRON ) 1000 MG/10ML topical solution 500 mg (500 mg Topical Given 08/06/23 1131)  morphine  (PF) 4 MG/ML injection 4 mg (4 mg Intravenous Given 08/06/23 1150)  ondansetron  (ZOFRAN ) injection 4 mg (4 mg Intravenous Given 08/06/23 1150)                                    Medical Decision Making Amount and/or Complexity of Data Reviewed Labs: ordered. Radiology: ordered.  Risk OTC drugs. Prescription drug management.   This patient presents to the ED for concern of LOC, this involves an extensive number of treatment options, and is a complaint that carries with it a high risk of complications and morbidity.  The differential diagnosis includes intercranial abnormality, PE, dissection, ACS, electrolyte abnormality, dehydration, vasovagal syncope   Co morbidities that complicate the patient evaluation  Noncontributory   Lab Tests:  I personally interpreted labs.  The pertinent results include:   Mild leukocytosis at 11.5.  Hemoglobin is 13.4 CMP shows no electrolyte abnormality.  Normal liver kidney function.  Pregnancy test is negative.  Urine with small leukocytes but also squamous cells are present so possible contamination.   Imaging Studies ordered:  I ordered imaging studies including CT head, CT maxillofacial I independently visualized and interpreted imaging which showed mildly displaced nasal fracture. I agree with the radiologist interpretation Given right flank pain will obtain CT renal study and CT lumbar no acute findings.    Cardiac Monitoring: / EKG:  The patient was maintained on a cardiac monitor.  I personally viewed and interpreted the cardiac monitored which showed an underlying rhythm of: Normal sinus  rhythm   Consultations Obtained:  I requested consultation with the ENT,  and discussed lab and imaging findings as well as pertinent plan - they recommend: Trial of ice water, patient did have improvement in symptoms with the Rhino Rocket and ice water.   Problem List / ED Course / Critical interventions / Medication management  Patient reports to emergency room with loss of consciousness after sitting on the toilet.  When she passed out she did hit her head she was out for approximately 5 seconds. She presents with left sided nose bleed since about 7am. It has not stopped with direct pressure, txa, afrin. Rhino rocket was placed and eventually area did stop bleeding. Also has small laceration to the bridge of her nose that was cleaned and Dermabond was used for wound closure.  Tetanus shot was updated here.  No associated chest  pain shortness of breath or cough with this.  No recent travel.  No history of DVT PE.  Lungs are clear to auscultation bilaterally. Prior to LOC she was experiencing some right sided flank pain.  On exam she does have some reported right lower quadrant pain but is not reproducible on exam.  Will order CT scan of abdomen and pelvis and lumbar spine.  CT of her head shows no acute findings.  CT maxillofacial shows mildly displaced nose fracture.  Otherwise hemoglobin is stable.  Labs otherwise reassuring. Vitals are stable.  I ordered medication including morphine  and Zofran . I have reviewed the patients home medicines and have made adjustments as needed   Plan F/u w/ PCP in 2-3d to ensure resolution of sx.  Patient was given return precautions. Patient stable for discharge at this time.  Patient educated on sx/dx and verbalized understanding of plan. Return to ER w/ new or worsening sx.       Final diagnoses:  Epistaxis  Closed fracture of nasal bone, initial encounter  Laceration of nose, initial encounter  Right flank pain    ED Discharge Orders           Ordered    methocarbamol  (ROBAXIN ) 500 MG tablet  2 times daily        08/06/23 1408    HYDROcodone -acetaminophen  (NORCO/VICODIN) 5-325 MG tablet  Every 6 hours PRN        08/06/23 1408               Deveron Shamoon, Warren SAILOR, PA-C 08/06/23 1431    Melvenia Motto, MD 08/06/23 1601

## 2023-08-06 NOTE — ED Triage Notes (Signed)
 Pt c.o right sided lower back pain that radiates around to her flank. Pt denies any urinary s/s. Pt got up this morning, went to the bathroom and the next thing she remembers is waking up in the floor. Pt has gash to the bridge of her nose and her nose will not stop bleeding from her left nare.

## 2023-08-06 NOTE — Discharge Instructions (Signed)
 For the [employer] I would recommend 2 to 3 days getting this removed with ENT.  You can call our ENT doctor or your ENT doctor to have this removed.  For nose fracture you can apply ice.  Alternate Tylenol  and ibuprofen.  For back pain take Robaxin  or Norco for breakthrough pain.  Return to emergency room with new or worsening symptoms.

## 2023-08-09 ENCOUNTER — Ambulatory Visit (INDEPENDENT_AMBULATORY_CARE_PROVIDER_SITE_OTHER): Admitting: Otolaryngology

## 2023-08-09 ENCOUNTER — Encounter (INDEPENDENT_AMBULATORY_CARE_PROVIDER_SITE_OTHER): Payer: Self-pay | Admitting: Otolaryngology

## 2023-08-09 VITALS — BP 131/88 | HR 80

## 2023-08-09 DIAGNOSIS — J343 Hypertrophy of nasal turbinates: Secondary | ICD-10-CM | POA: Diagnosis not present

## 2023-08-09 DIAGNOSIS — J3489 Other specified disorders of nose and nasal sinuses: Secondary | ICD-10-CM

## 2023-08-09 DIAGNOSIS — J310 Chronic rhinitis: Secondary | ICD-10-CM | POA: Diagnosis not present

## 2023-08-09 DIAGNOSIS — S022XXA Fracture of nasal bones, initial encounter for closed fracture: Secondary | ICD-10-CM

## 2023-08-09 DIAGNOSIS — R040 Epistaxis: Secondary | ICD-10-CM

## 2023-08-09 DIAGNOSIS — J342 Deviated nasal septum: Secondary | ICD-10-CM | POA: Diagnosis not present

## 2023-08-10 DIAGNOSIS — J310 Chronic rhinitis: Secondary | ICD-10-CM | POA: Insufficient documentation

## 2023-08-10 DIAGNOSIS — J343 Hypertrophy of nasal turbinates: Secondary | ICD-10-CM | POA: Insufficient documentation

## 2023-08-10 DIAGNOSIS — J342 Deviated nasal septum: Secondary | ICD-10-CM | POA: Insufficient documentation

## 2023-08-10 DIAGNOSIS — S022XXA Fracture of nasal bones, initial encounter for closed fracture: Secondary | ICD-10-CM | POA: Insufficient documentation

## 2023-08-10 DIAGNOSIS — R040 Epistaxis: Secondary | ICD-10-CM | POA: Insufficient documentation

## 2023-08-10 NOTE — Progress Notes (Signed)
 CC: Nasal fractures, severe epistaxis  HPI:  Marilyn Bryant is a 43 y.o. female who presents today for evaluation of her nasal fractures and severe epistaxis.  According to the patient, she had a syncope episode on August 06, 2023.  She fell face first onto the ground.  It resulted in severe epistaxis and nasal trauma.  She was evaluated at the [facility] emergency room.  Her facial CT scan showed bilateral nasal fractures, with dorsal deviation to the left.  Her epistaxis was controlled with a Merocel packing.  The patient has no previous history of nasal surgery.  She denies any significant breathing difficulty.  Past Medical History:  Diagnosis Date   Hearing difficulty of both ears    wears hearing aids    Past Surgical History:  Procedure Laterality Date   CESAREAN SECTION     2010   ROBOTIC ASSISTED LAPAROSCOPIC OVARIAN CYSTECTOMY Right 06/11/2013   Procedure: ROBOTIC ASSISTED LAPAROSCOPIC  RIGHT OVARIAN CYSTECTOMY;  Surgeon: Percilla Burly, MD;  Location: [facility] ORS;  Service: Gynecology;  Laterality: Right;  90 min.    Family History  Problem Relation Age of Onset   Hypertension Father     Social History:  reports that she has never smoked. She does not have any smokeless tobacco history on file. She reports that she does not drink alcohol and does not use drugs.  Allergies: No Known Allergies  Prior to Admission medications   Medication Sig Start Date End Date Taking? Authorizing Provider  cetirizine (ZYRTEC) 10 MG tablet Take 10 mg by mouth daily.   Yes [provider]  HYDROcodone -acetaminophen  (NORCO/VICODIN) 5-325 MG tablet Take 1 tablet by mouth every 6 (six) hours as needed for severe pain (pain score 7-10). 08/06/23  Yes Barrett, Jamie N, PA-C  methocarbamol  (ROBAXIN ) 500 MG tablet Take 1 tablet (500 mg total) by mouth 2 (two) times daily. 08/06/23  Yes Barrett, Jamie N, PA-C  norethindrone-ethinyl estradiol (LOESTRIN FE) 1-20 MG-MCG tablet Take 1 tablet by mouth  daily.   Yes [provider]  predniSONE  (DELTASONE ) 20 MG tablet Take 1 tablet (20 mg total) by mouth daily with breakfast. 09/18/18  Yes Nida, Gebreselassie W, MD  propranolol  (INDERAL ) 40 MG tablet Take 1 tablet (40 mg total) by mouth 3 (three) times daily. 09/04/18  Yes Lenis Ethelle ORN, MD    Blood pressure 131/88, pulse 80, last menstrual period 06/07/2023, SpO2 98%. Exam: General: Communicates without difficulty, well nourished, no acute distress. Head: Normocephalic, no evidence injury, no tenderness, facial buttresses intact without stepoff. Face/sinus: No tenderness to palpation and percussion. Facial movement is normal and symmetric. Eyes: PERRL, EOMI. No scleral icterus, conjunctivae clear. Neuro: CN II exam reveals vision grossly intact.  No nystagmus at any point of gaze. Ears: Auricles well formed without lesions.  Ear canals are intact without mass or lesion.  No erythema or edema is appreciated.  The TMs are intact without fluid. Nose: External evaluation reveals nasal dorsal deviation to the left.   Anterior rhinoscopy reveals congested mucosa over anterior aspect of inferior turbinates and deviated septum.  No bleeding noted. Oral:  Oral cavity and oropharynx are intact, symmetric, without erythema or edema.  Mucosa is moist without lesions. Neck: Full range of motion without pain.  There is no significant lymphadenopathy.  No masses palpable.  Thyroid  bed within normal limits to palpation.  Parotid glands and submandibular glands equal bilaterally without mass.  Trachea is midline. Neuro:  CN 2-12 grossly intact.   Procedure:  Flexible Nasal Endoscopy: Description: Risks, benefits, and alternatives of flexible endoscopy were explained to the patient.  Specific mention was made of the risk of throat numbness with difficulty swallowing, possible bleeding from the nose and mouth, and pain from the procedure.  The patient gave oral consent to proceed.  The flexible scope was  inserted into the right nasal cavity.  Endoscopy of the interior nasal cavity, superior, inferior, and middle meatus was performed. The sphenoid-ethmoid recess was examined. Edematous mucosa was noted.  No polyp, mass, or lesion was appreciated. Nasal septal deviation noted. Olfactory cleft was clear.  Nasopharynx was clear.  Turbinates were hypertrophied but without mass.  The procedure was repeated on the contralateral side with similar findings.  The patient tolerated the procedure well.   Assessment: 1.  Bilateral nasal fractures, with nasal dorsal deviation to the left. 2.  The patient is also noted to have chronic rhinitis, significant nasal septal deviation, and bilateral inferior turbinate hypertrophy.  The patient denies any significant breathing difficulty. 3.  Her nasal packing is removed without difficulty.  No bleeding is noted today.  Plan: 1.  The physical exam and nasal endoscopy findings are reviewed with the patient. 2.  Her CT images are also reviewed. 3.  The treatment options are discussed.  The options include conservative observation versus surgical intervention with close reduction of her nasal fractures with stabilization.  The risk, benefits, and details of the procedure are extensively discussed.  Questions are invited and answered. 4.  The patient would like to consider her options.  She is encouraged to call with any questions or concerns.  Margrette Wynia W Jolane Bankhead 08/10/2023, 10:15 AM

## 2023-08-15 DIAGNOSIS — R519 Headache, unspecified: Secondary | ICD-10-CM | POA: Diagnosis not present

## 2023-08-15 DIAGNOSIS — Z6824 Body mass index (BMI) 24.0-24.9, adult: Secondary | ICD-10-CM | POA: Diagnosis not present

## 2023-08-15 DIAGNOSIS — S060X9A Concussion with loss of consciousness of unspecified duration, initial encounter: Secondary | ICD-10-CM | POA: Diagnosis not present

## 2023-08-15 DIAGNOSIS — S022XXS Fracture of nasal bones, sequela: Secondary | ICD-10-CM | POA: Diagnosis not present

## 2023-08-24 DIAGNOSIS — L821 Other seborrheic keratosis: Secondary | ICD-10-CM | POA: Diagnosis not present

## 2023-09-12 DIAGNOSIS — R55 Syncope and collapse: Secondary | ICD-10-CM | POA: Diagnosis not present

## 2023-09-12 DIAGNOSIS — Z6825 Body mass index (BMI) 25.0-25.9, adult: Secondary | ICD-10-CM | POA: Diagnosis not present

## 2023-09-12 DIAGNOSIS — R7989 Other specified abnormal findings of blood chemistry: Secondary | ICD-10-CM | POA: Diagnosis not present

## 2023-11-04 ENCOUNTER — Encounter: Payer: Self-pay | Admitting: Cardiology

## 2023-11-04 ENCOUNTER — Ambulatory Visit: Attending: Cardiology | Admitting: Cardiology

## 2023-11-04 ENCOUNTER — Ambulatory Visit (INDEPENDENT_AMBULATORY_CARE_PROVIDER_SITE_OTHER)

## 2023-11-04 VITALS — BP 118/72 | HR 83 | Ht 62.0 in | Wt 155.2 lb

## 2023-11-04 DIAGNOSIS — R55 Syncope and collapse: Secondary | ICD-10-CM

## 2023-11-04 NOTE — Patient Instructions (Signed)
 Medication Instructions:  Your physician recommends that you continue on your current medications as directed. Please refer to the Current Medication list given to you today.   *If you need a refill on your cardiac medications before your next appointment, please call your pharmacy*  Lab Work: No labs ordered today  If you have labs (blood work) drawn today and your tests are completely normal, you will receive your results only by: MyChart Message (if you have MyChart) OR A paper copy in the mail If you have any lab test that is abnormal or we need to change your treatment, we will call you to review the results.  Testing/Procedures: Your physician has requested that you have an echocardiogram. Echocardiography is a painless test that uses sound waves to create images of your heart. It provides your doctor with information about the size and shape of your heart and how well your heart's chambers and valves are working.   You may receive an ultrasound enhancing agent through an IV if needed to better visualize your heart during the echo. This procedure takes approximately one hour.  There are no restrictions for this procedure.  This will take place at [address] [facility]) [address], [place] [address]  Please note: We ask at that you not bring children with you during ultrasound (echo/ vascular) testing. Due to room size and safety concerns, children are not allowed in the ultrasound rooms during exams. Our front office staff cannot provide observation of children in our lobby area while testing is being conducted. An adult accompanying a patient to their appointment will only be allowed in the ultrasound room at the discretion of the ultrasound technician under special circumstances. We apologize for any inconvenience.   ZIO XT- Long Term Monitor Instructions  Your physician has requested you wear a ZIO patch monitor for 14 days.  This is a single patch monitor. [employer]  supplies one patch monitor per enrollment. Additional stickers are not available. Please do not apply patch if you will be having a Nuclear Stress Test, Echocardiogram, Cardiac CT, MRI, or Chest Xray during the period you would be wearing the monitor. The patch cannot be worn during these tests. You cannot remove and re-apply the ZIO XT patch monitor.  Your ZIO patch monitor will be mailed 3 day USPS to your address on file. It may take 3-5 days to receive your monitor after you have been enrolled. Once you have received your monitor, please review the enclosed instructions. Your monitor has already been registered assigning a specific monitor serial number to you.  Billing and Patient Assistance Program Information  We have supplied [employer] with any of your insurance information on file for billing purposes.  [employer] offers a sliding scale Patient Assistance Program for patients that do not have insurance, or whose insurance does not completely cover the cost of the ZIO monitor.  You must apply for the Patient Assistance Program to qualify for this discounted rate.  To apply, please call [employer] at [phone], select option 4, select option 2, ask to apply for Patient Assistance Program. Meredeth will ask your household income, and how many people are in your household. They will quote your out-of-pocket cost based on that information. [employer] will also be able to set up a 17-month, interest-free payment plan if needed.  Applying the monitor   Shave hair from upper left chest.  Hold abrader disc by orange tab. Rub abrader in 40 strokes over the upper left chest as indicated  in your monitor instructions.  Clean area with 4 enclosed alcohol pads. Let dry.  Apply patch as indicated in monitor instructions. Patch will be placed under collarbone on left side of chest with arrow pointing upward.  Rub patch adhesive wings for 2 minutes. Remove white label marked 1. Remove the white label marked 2. Rub  patch adhesive wings for 2 additional minutes.  While looking in a mirror, press and release button in center of patch. A small green light will flash 3-4 times. This will be your only indicator that the monitor has been turned on.  Do not shower for the first 24 hours. You may shower after the first 24 hours.  Press the button if you feel a symptom. You will hear a small click. Record Date, Time and Symptom in the Patient Logbook.  When you are ready to remove the patch, follow instructions on the last 2 pages of Patient Logbook.  Stick patch monitor into the tabs at the bottom of the return box.  Place Patient Logbook in the blue and white box. Use locking tab on box and tape box closed securely. The blue and white box has prepaid postage on it. Please place it in the mailbox as soon as possible. Your physician should have your test results approximately 7-14 days after the monitor has been mailed back to [facility].  Call [facility] Customer Care at [phone] if you have questions regarding your ZIO XT patch monitor.  Call them immediately if you see an orange light blinking on your monitor.  If your monitor falls off in less than 4 days, contact our Monitor department at [phone].  If your monitor becomes loose or falls off after 4 days call [employer] at [phone] for suggestions on securing your monitor.   Follow-Up: At [facility], you and your health needs are our priority.  As part of our continuing mission to provide you with exceptional heart care, our providers are all part of one team.  This team includes your primary Cardiologist (physician) and Advanced Practice Providers or APPs (Physician Assistants and Nurse Practitioners) who all work together to provide you with the care you need, when you need it.  Your next appointment:   3 month(s)  Provider:   You may see Dr. Darliss or one of the following Advanced Practice Providers on your designated Care  Team:   Lonni Meager, NP Lesley Maffucci, PA-C Bernardino Bring, PA-C Cadence [place], PA-C Tylene Lunch, NP Barnie Hila, NP    We recommend signing up for the patient portal called MyChart.  Sign up information is provided on this After Visit Summary.  MyChart is used to connect with patients for Virtual Visits (Telemedicine).  Patients are able to view lab/test results, encounter notes, upcoming appointments, etc.  Non-urgent messages can be sent to your provider as well.   To learn more about what you can do with MyChart, go to [email or web address].

## 2023-11-04 NOTE — Progress Notes (Signed)
 Cardiology Office Note:    Date:  11/04/2023   ID:  Marilyn Bryant, DOB [birth year], MRN [id number]  PCP:  Marvine Rush, MD   [facility] Providers Cardiologist:  None     Referring MD: Marvine Rush, MD   Chief Complaint  Patient presents with   New Patient (Initial Visit)    New patient . The pt has no complaints of chest pain or SOB. The patient reports chest tightness which she had 1 episode last night less than 10 min.  medciation reviewed verbally with patient   Marilyn Bryant is a 43 y.o. female who is being seen today for the evaluation of syncope at the request of Marvine Rush, MD.   History of Present Illness:    Marilyn Bryant is a 43 y.o. female with history of back pain, degenerative disease presenting with syncope.  Had an episode of syncope 2 months ago while at home.  She states having significant back pain at the time, walked to the bathroom and sat in toilet in order to urinate.  Does not remember much, but apparently fell hitting her forehead and breaking her nose leading to nosebleeding.  Patient's husband was at home who told her she was down for a minute or so.  She denies chest pain, shortness of breath, palpitations prior to fall.  Was taken to the ED where workup with head CT was unrevealing, nasal bone fracture and soft tissue swelling noted.  Endorses previous fall/syncopal episode about 10 years ago also in the context of back pain.  MRI at the time showed mild degenerative disc disease in L4, L5.  Currently seeing orthopedic surgery, referral has been placed to neurosurgery which is pending.  Complains of back pain, now radiating to her groin area.  Denies any history of heart disease.  Past Medical History:  Diagnosis Date   Hearing difficulty of both ears    wears hearing aids    Past Surgical History:  Procedure Laterality Date   CESAREAN SECTION     2010   ROBOTIC ASSISTED LAPAROSCOPIC OVARIAN CYSTECTOMY Right 06/11/2013    Procedure: ROBOTIC ASSISTED LAPAROSCOPIC  RIGHT OVARIAN CYSTECTOMY;  Surgeon: Percilla Burly, MD;  Location: [facility];  Service: Gynecology;  Laterality: Right;  90 min.    Current Medications: Current Meds  Medication Sig   cetirizine (ZYRTEC) 10 MG tablet Take 10 mg by mouth daily.   norethindrone-ethinyl estradiol (LOESTRIN FE) 1-20 MG-MCG tablet Take 1 tablet by mouth daily.     Allergies:   Patient has no known allergies.   Social History   Socioeconomic History   Marital status: Married    Spouse name: Not on file   Number of children: Not on file   Years of education: Not on file   Highest education level: Not on file  Occupational History   Not on file  Tobacco Use   Smoking status: Never   Smokeless tobacco: Not on file  Substance and Sexual Activity   Alcohol use: No   Drug use: No   Sexual activity: Not on file  Other Topics Concern   Not on file  Social History Narrative   Not on file   Social Drivers of Health   Financial Resource Strain: Not on file  Food Insecurity: Not on file  Transportation Needs: Not on file  Physical Activity: Not on file  Stress: Not on file  Social Connections: Not on file     Family History: The patient's family  history includes Healthy in her mother and sister; Heart disease in her paternal grandfather; Hypertension in her father.  ROS:   Please see the history of present illness.     All other systems reviewed and are negative.  EKGs/Labs/Other Studies Reviewed:    The following studies were reviewed today:  EKG Interpretation Date/Time:  Friday November 04 2023 11:20:47 EDT Ventricular Rate:  83 PR Interval:  148 QRS Duration:  76 QT Interval:  370 QTC Calculation: 434 R Axis:   61  Text Interpretation: Normal sinus rhythm Normal ECG Confirmed by Darliss Rogue ([id number]) on 11/04/2023 11:25:16 AM    Recent Labs: 08/06/2023: ALT 23; BUN 11; Creatinine, Ser 0.84; Hemoglobin 13.7; Platelets 325; Potassium 4.0;  Sodium 137  Recent Lipid Panel No results found for: CHOL, TRIG, HDL, CHOLHDL, VLDL, LDLCALC, LDLDIRECT   Risk Assessment/Calculations:             Physical Exam:    VS:  BP 118/72 (BP Location: Right Arm, Patient Position: Sitting)   Pulse 83   Ht 5' 2 (1.575 m)   Wt 155 lb 3.2 oz (70.4 kg)   BMI 28.39 kg/m     Wt Readings from Last 3 Encounters:  11/04/23 155 lb 3.2 oz (70.4 kg)  09/04/18 130 lb (59 kg)  08/17/18 129 lb (58.5 kg)     GEN:  Well nourished, well developed in no acute distress HEENT: Normal NECK: No JVD; No carotid bruits CARDIAC: RRR, no murmurs, rubs, gallops RESPIRATORY:  Clear to auscultation without rales, wheezing or rhonchi  ABDOMEN: Soft, non-tender, non-distended MUSCULOSKELETAL:  No edema; No deformity  SKIN: Warm and dry NEUROLOGIC:  Alert and oriented x 3 PSYCHIATRIC:  Normal affect   ASSESSMENT:    1. Syncope and collapse    PLAN:    In order of problems listed above:  Syncope, etiology appears vasovagal from significant pain.  Denies palpitations, chest pain or shortness of breath.  EKG today is normal.  Rule out cardiac causes such as arrhythmia with placing cardiac monitor, will obtain echo to evaluate any structural abnormalities.  Agree with referral to neurosurgery due to symptoms of occurring after back pain, especially with patient having history of degenerative disc disease as per lumbar MR in 2015, and pain now radiating to her groin  Follow-up after cardiac testing       Medication Adjustments/Labs and Tests Ordered: Current medicines are reviewed at length with the patient today.  Concerns regarding medicines are outlined above.  Orders Placed This Encounter  Procedures   LONG TERM MONITOR (3-14 DAYS)   EKG 12-Lead   ECHOCARDIOGRAM COMPLETE   No orders of the defined types were placed in this encounter.   Patient Instructions  Medication Instructions:  Your physician recommends that you continue on  your current medications as directed. Please refer to the Current Medication list given to you today.   *If you need a refill on your cardiac medications before your next appointment, please call your pharmacy*  Lab Work: No labs ordered today  If you have labs (blood work) drawn today and your tests are completely normal, you will receive your results only by: MyChart Message (if you have MyChart) OR A paper copy in the mail If you have any lab test that is abnormal or we need to change your treatment, we will call you to review the results.  Testing/Procedures: Your physician has requested that you have an echocardiogram. Echocardiography is a painless test that uses sound waves  to create images of your heart. It provides your doctor with information about the size and shape of your heart and how well your heart's chambers and valves are working.   You may receive an ultrasound enhancing agent through an IV if needed to better visualize your heart during the echo. This procedure takes approximately one hour.  There are no restrictions for this procedure.  This will take place at [address] [facility]) [address], [place] [address]  Please note: We ask at that you not bring children with you during ultrasound (echo/ vascular) testing. Due to room size and safety concerns, children are not allowed in the ultrasound rooms during exams. Our front office staff cannot provide observation of children in our lobby area while testing is being conducted. An adult accompanying a patient to their appointment will only be allowed in the ultrasound room at the discretion of the ultrasound technician under special circumstances. We apologize for any inconvenience.   ZIO XT- Long Term Monitor Instructions  Your physician has requested you wear a ZIO patch monitor for 14 days.  This is a single patch monitor. [employer] supplies one patch monitor per enrollment. Additional stickers are not  available. Please do not apply patch if you will be having a Nuclear Stress Test, Echocardiogram, Cardiac CT, MRI, or Chest Xray during the period you would be wearing the monitor. The patch cannot be worn during these tests. You cannot remove and re-apply the ZIO XT patch monitor.  Your ZIO patch monitor will be mailed 3 day USPS to your address on file. It may take 3-5 days to receive your monitor after you have been enrolled. Once you have received your monitor, please review the enclosed instructions. Your monitor has already been registered assigning a specific monitor serial number to you.  Billing and Patient Assistance Program Information  We have supplied [employer] with any of your insurance information on file for billing purposes.  [employer] offers a sliding scale Patient Assistance Program for patients that do not have insurance, or whose insurance does not completely cover the cost of the ZIO monitor.  You must apply for the Patient Assistance Program to qualify for this discounted rate.  To apply, please call [employer] at [phone], select option 4, select option 2, ask to apply for Patient Assistance Program. Meredeth will ask your household income, and how many people are in your household. They will quote your out-of-pocket cost based on that information. [employer] will also be able to set up a 30-month, interest-free payment plan if needed.  Applying the monitor   Shave hair from upper left chest.  Hold abrader disc by orange tab. Rub abrader in 40 strokes over the upper left chest as indicated in your monitor instructions.  Clean area with 4 enclosed alcohol pads. Let dry.  Apply patch as indicated in monitor instructions. Patch will be placed under collarbone on left side of chest with arrow pointing upward.  Rub patch adhesive wings for 2 minutes. Remove white label marked 1. Remove the white label marked 2. Rub patch adhesive wings for 2 additional minutes.  While looking in a  mirror, press and release button in center of patch. A small green light will flash 3-4 times. This will be your only indicator that the monitor has been turned on.  Do not shower for the first 24 hours. You may shower after the first 24 hours.  Press the button if you feel a symptom. You will hear a small  click. Record Date, Time and Symptom in the Patient Logbook.  When you are ready to remove the patch, follow instructions on the last 2 pages of Patient Logbook.  Stick patch monitor into the tabs at the bottom of the return box.  Place Patient Logbook in the blue and white box. Use locking tab on box and tape box closed securely. The blue and white box has prepaid postage on it. Please place it in the mailbox as soon as possible. Your physician should have your test results approximately 7-14 days after the monitor has been mailed back to [facility].  Call [facility] Customer Care at [phone] if you have questions regarding your ZIO XT patch monitor.  Call them immediately if you see an orange light blinking on your monitor.  If your monitor falls off in less than 4 days, contact our Monitor department at [phone].  If your monitor becomes loose or falls off after 4 days call [employer] at [phone] for suggestions on securing your monitor.   Follow-Up: At [facility], you and your health needs are our priority.  As part of our continuing mission to provide you with exceptional heart care, our providers are all part of one team.  This team includes your primary Cardiologist (physician) and Advanced Practice Providers or APPs (Physician Assistants and Nurse Practitioners) who all work together to provide you with the care you need, when you need it.  Your next appointment:   3 month(s)  Provider:   You may see Dr Darliss or one of the following Advanced Practice Providers on your designated Care Team:   Lonni Meager, NP Lesley Maffucci, PA-C Bernardino Bring,  PA-C Cadence [place], PA-C Tylene Lunch, NP Barnie Hila, NP    We recommend signing up for the patient portal called MyChart.  Sign up information is provided on this After Visit Summary.  MyChart is used to connect with patients for Virtual Visits (Telemedicine).  Patients are able to view lab/test results, encounter notes, upcoming appointments, etc.  Non-urgent messages can be sent to your provider as well.   To learn more about what you can do with MyChart, go to [email or web address].              Signed, Redell Darliss, MD  11/04/2023 12:28 PM    [facility]

## 2023-11-09 ENCOUNTER — Ambulatory Visit (HOSPITAL_COMMUNITY)

## 2023-11-09 ENCOUNTER — Other Ambulatory Visit (HOSPITAL_COMMUNITY)

## 2023-11-10 ENCOUNTER — Ambulatory Visit (HOSPITAL_COMMUNITY)
Admission: RE | Admit: 2023-11-10 | Discharge: 2023-11-10 | Disposition: A | Discharge status: Home / Self Care | Source: Ambulatory Visit | Attending: Cardiology | Admitting: Cardiology

## 2023-11-10 DIAGNOSIS — R55 Syncope and collapse: Secondary | ICD-10-CM | POA: Insufficient documentation

## 2023-11-10 LAB — ECHOCARDIOGRAM COMPLETE
AR max vel: 2.49 cm2
AV Area VTI: 2.45 cm2
AV Area mean vel: 2.46 cm2
AV Mean grad: 2.8 mmHg
AV Peak grad: 5.9 mmHg
Ao pk vel: 1.22 m/s
Area-P 1/2: 2.91 cm2
S' Lateral: 2.7 cm

## 2023-11-10 NOTE — Progress Notes (Signed)
*  PRELIMINARY RESULTS* Echocardiogram 2D Echocardiogram has been performed.  Marilyn Bryant 11/10/2023, 2:01 PM

## 2023-11-11 ENCOUNTER — Ambulatory Visit: Payer: Self-pay | Admitting: Cardiology

## 2023-11-14 ENCOUNTER — Ambulatory Visit: Admitting: Cardiology

## 2023-12-07 DIAGNOSIS — R55 Syncope and collapse: Secondary | ICD-10-CM | POA: Diagnosis not present

## 2023-12-23 ENCOUNTER — Ambulatory Visit

## 2024-01-11 ENCOUNTER — Ambulatory Visit: Admitting: Cardiology

## 2024-02-06 ENCOUNTER — Ambulatory Visit: Admitting: Student

## 2024-02-06 NOTE — Progress Notes (Unsigned)
 "  Cardiology Clinic Note   Date: 02/10/2024 ID: Roneisha, Stern [birth year], MRN [id number]  Primary Cardiologist:  Redell Cave, MD  Chief Complaint   KIMI BORDEAU is a 44 y.o. female who presents to the clinic today for follow up after testing.   Patient Profile   DAMIEN CISAR is followed by Dr. Cave for the history outlined below.      Past medical history significant for: Syncope Echo 11/10/2023: EF 60 to 65%.  No RWMA.  Grade I DD.  Normal RV size/function.  No significant valvular abnormalities. 14-day Zio 12/05/2023: HR 58 to 165 bpm, average 88 bpm.  Predominantly sinus rhythm.  1 run of SVT lasting 6 beats max rate 113 bpm, average 103 bpm.  Rare ectopy.  No A-fib/a-flutter.  No sustained arrhythmias. Subacute thyroiditis.  In summary, patient was first evaluated by Dr. Cave on 11/04/2023 for syncope.  Patient reported an episode of syncope 2 months prior while at home.  She reported significant back pain at that time.  She walked to the bathroom to urinate and does not remember much after sitting on the toilet.  She apparently fell hitting her forehead and breaking her nose leading to nosebleeding.  Her husband reported she was held for 1 minute or so.  She denied chest pain, dyspnea, or palpitations prior to fall.  ED workup was unremarkable with head CT revealing nasal bone fracture and soft tissue swelling.  She reported a previous episode of syncope 10 years prior in the context of back pain.  She was being followed by orthopedic surgery and referral was placed to neurosurgery.  Echo demonstrated normal LV/RV function.  14-day ZIO demonstrated 1 run of SVT.     History of Present Illness    Today, patient is doing well with no further syncopal episodes. She reports she was evaluated by orthopedics and it was recommended to take aleve at the first sign of back pain. She is also working on exercises and stretching to strengthen her back and core.  Discussed heart monitor in detail. She feels that the times her heart rate was elevated could have been when she was under stress at work. 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 She has no cardiac complaints. Of note, she has lost 20 lb since October by following a Keto diet.     ROS: All other systems reviewed and are otherwise negative except as noted in History of Present Illness.  EKGs/Labs Reviewed       EKG is not performed today.   08/06/2023: ALT 23; AST 23; BUN 11; Creatinine, Ser 0.84; Potassium 4.0; Sodium 137   08/06/2023: Hemoglobin 13.7; WBC 11.5    Physical Exam    VS:  BP 104/76 (BP Location: Right Arm, Patient Position: Sitting, Cuff Size: Normal)   Pulse (!) 115   Resp 20   Ht 5' 4 (1.626 m)   Wt 135 lb (61.2 kg)   SpO2 98%   BMI 23.17 kg/m  , BMI Body mass index is 23.17 kg/m.  GEN: Well nourished, well developed, in no acute distress. Neck: No JVD or carotid bruits. Cardiac:  RRR.  No murmur. No rubs or gallops.   Respiratory:  Respirations regular and unlabored. Clear to auscultation without rales, wheezing or rhonchi. GI: Soft, nontender, nondistended. Extremities: Radials/DP/PT 2+ and equal bilaterally. No clubbing or cyanosis. No edema   Skin: Warm and dry, no rash. Neuro: Strength intact.  Assessment & Plan   Syncope Echo November 2025 demonstrated normal LV/RV function, no significant valvular abnormalities.  14-day ZIO demonstrated HR 58 to 165 bpm, average 85 bpm, 1 run of SVT lasting 6 beats max rate 113 bpm, rare ectopy, no arrhythmias.  Patient denies further episodes of syncope. Since episodes have been in the setting of back pain it was recommended by orthopedics to take aleve at the first sign of back pain to avoid further episodes. So far, she has not had any issues. She is doing exercises and stretching to strengthen back and core muscles.  - Continue to monitor.   Tachycardia/PSVT Patient  feels episodes of tachycardia when she wore the heart monitor were related to stress at work. Her resting heart today was 115 on arrival and 93 on recheck. She reports drinking diet mountain dew.  - Encouraged to reduce caffeine intake.   Positional lightheadedness She reports occasional lightheadedness when going from sit to stand. She admits to not drinking enough water throughout the day.  - Discussed increasing hydration, liberalizing sodium and trying compression socks to see if this helped improve symptoms.   Disposition: Return as needed.          Signed, Barnie HERO. Makara Lanzo, DNP, NP-C  "

## 2024-02-10 ENCOUNTER — Encounter: Payer: Self-pay | Admitting: Student

## 2024-02-10 ENCOUNTER — Ambulatory Visit: Admitting: Student

## 2024-02-10 VITALS — BP 104/76 | HR 93 | Resp 20 | Ht 64.0 in | Wt 135.0 lb

## 2024-02-10 DIAGNOSIS — R55 Syncope and collapse: Secondary | ICD-10-CM

## 2024-02-10 DIAGNOSIS — R000 Tachycardia, unspecified: Secondary | ICD-10-CM

## 2024-02-10 DIAGNOSIS — R42 Dizziness and giddiness: Secondary | ICD-10-CM

## 2024-02-10 NOTE — Patient Instructions (Signed)
 Medication Instructions:   Your physician recommends that you continue on your current medications as directed. Please refer to the Current Medication list given to you today.    *If you need a refill on your cardiac medications before your next appointment, please call your pharmacy*  Lab Work:  None ordered at this time   If you have labs (blood work) drawn today and your tests are completely normal, you will receive your results only by:  MyChart Message (if you have MyChart) OR  A paper copy in the mail If you have any lab test that is abnormal or we need to change your treatment, we will call you to review the results.  Testing/Procedures:  None ordered at this time   Referrals:  None ordered at this time   Follow-Up:  At [facility], you and your health needs are our priority.  As part of our continuing mission to provide you with exceptional heart care, our providers are all part of one team.  This team includes your primary Cardiologist (physician) and Advanced Practice Providers or APPs (Physician Assistants and Nurse Practitioners) who all work together to provide you with the care you need, when you need it.  Your next appointment:    Follow up as needed.  Provider:    Barnie Hila, NP    We recommend signing up for the patient portal called MyChart.  Sign up information is provided on this After Visit Summary.  MyChart is used to connect with patients for Virtual Visits (Telemedicine).  Patients are able to view lab/test results, encounter notes, upcoming appointments, etc.  Non-urgent messages can be sent to your provider as well.   To learn more about what you can do with MyChart, go to [email or web address].
